# Patient Record
Sex: Female | Born: 1960 | Race: Black or African American | Hispanic: No | Marital: Married | Smoking: Current every day smoker
Health system: Southern US, Community
[De-identification: ages and names within clinical notes are randomized; demographics above are authoritative.]

## PROBLEM LIST (undated history)

## (undated) DIAGNOSIS — F329 Major depressive disorder, single episode, unspecified: Secondary | ICD-10-CM

## (undated) DIAGNOSIS — F32A Depression, unspecified: Secondary | ICD-10-CM

## (undated) DIAGNOSIS — K2970 Gastritis, unspecified, without bleeding: Secondary | ICD-10-CM

## (undated) DIAGNOSIS — F1010 Alcohol abuse, uncomplicated: Secondary | ICD-10-CM

## (undated) DIAGNOSIS — K8020 Calculus of gallbladder without cholecystitis without obstruction: Secondary | ICD-10-CM

## (undated) DIAGNOSIS — F419 Anxiety disorder, unspecified: Secondary | ICD-10-CM

## (undated) DIAGNOSIS — K859 Acute pancreatitis without necrosis or infection, unspecified: Secondary | ICD-10-CM

## (undated) DIAGNOSIS — K209 Esophagitis, unspecified without bleeding: Secondary | ICD-10-CM

## (undated) DIAGNOSIS — K922 Gastrointestinal hemorrhage, unspecified: Secondary | ICD-10-CM

## (undated) DIAGNOSIS — R519 Headache, unspecified: Secondary | ICD-10-CM

## (undated) DIAGNOSIS — D696 Thrombocytopenia, unspecified: Secondary | ICD-10-CM

## (undated) DIAGNOSIS — K311 Adult hypertrophic pyloric stenosis: Secondary | ICD-10-CM

## (undated) DIAGNOSIS — D649 Anemia, unspecified: Secondary | ICD-10-CM

## (undated) DIAGNOSIS — R51 Headache: Secondary | ICD-10-CM

## (undated) DIAGNOSIS — R0602 Shortness of breath: Secondary | ICD-10-CM

## (undated) HISTORY — PX: FOOT SURGERY: SHX648

## (undated) HISTORY — DX: Thrombocytopenia, unspecified: D69.6

## (undated) HISTORY — DX: Anemia, unspecified: D64.9

## (undated) HISTORY — PX: TOTAL ABDOMINAL HYSTERECTOMY: SHX209

## (undated) HISTORY — DX: Gastrointestinal hemorrhage, unspecified: K92.2

## (undated) HISTORY — DX: Gastritis, unspecified, without bleeding: K29.70

## (undated) HISTORY — DX: Esophagitis, unspecified without bleeding: K20.90

## (undated) HISTORY — DX: Esophagitis, unspecified: K20.9

## (undated) HISTORY — DX: Adult hypertrophic pyloric stenosis: K31.1

## (undated) SURGERY — EGD (ESOPHAGOGASTRODUODENOSCOPY)
Anesthesia: Moderate Sedation

---

## 2012-03-29 ENCOUNTER — Emergency Department (HOSPITAL_COMMUNITY): Payer: Self-pay

## 2012-03-29 ENCOUNTER — Inpatient Hospital Stay (HOSPITAL_COMMUNITY)
Admission: EM | Admit: 2012-03-29 | Discharge: 2012-04-02 | DRG: 378 | Disposition: A | Discharge status: Home / Self Care | Payer: MEDICAID | Attending: Family Medicine | Admitting: Family Medicine

## 2012-03-29 ENCOUNTER — Encounter (HOSPITAL_COMMUNITY): Payer: Self-pay | Admitting: *Deleted

## 2012-03-29 DIAGNOSIS — F10239 Alcohol dependence with withdrawal, unspecified: Secondary | ICD-10-CM | POA: Diagnosis present

## 2012-03-29 DIAGNOSIS — K208 Other esophagitis without bleeding: Secondary | ICD-10-CM | POA: Diagnosis present

## 2012-03-29 DIAGNOSIS — K219 Gastro-esophageal reflux disease without esophagitis: Secondary | ICD-10-CM | POA: Diagnosis present

## 2012-03-29 DIAGNOSIS — D649 Anemia, unspecified: Secondary | ICD-10-CM | POA: Diagnosis present

## 2012-03-29 DIAGNOSIS — K922 Gastrointestinal hemorrhage, unspecified: Secondary | ICD-10-CM | POA: Diagnosis present

## 2012-03-29 DIAGNOSIS — F10939 Alcohol use, unspecified with withdrawal, unspecified: Secondary | ICD-10-CM | POA: Diagnosis present

## 2012-03-29 DIAGNOSIS — R1084 Generalized abdominal pain: Secondary | ICD-10-CM

## 2012-03-29 DIAGNOSIS — K228 Other specified diseases of esophagus: Secondary | ICD-10-CM | POA: Diagnosis present

## 2012-03-29 DIAGNOSIS — F1020 Alcohol dependence, uncomplicated: Secondary | ICD-10-CM | POA: Diagnosis present

## 2012-03-29 DIAGNOSIS — K2289 Other specified disease of esophagus: Secondary | ICD-10-CM | POA: Diagnosis present

## 2012-03-29 DIAGNOSIS — K2921 Alcoholic gastritis with bleeding: Principal | ICD-10-CM | POA: Diagnosis present

## 2012-03-29 DIAGNOSIS — T65891A Toxic effect of other specified substances, accidental (unintentional), initial encounter: Secondary | ICD-10-CM | POA: Diagnosis present

## 2012-03-29 DIAGNOSIS — R109 Unspecified abdominal pain: Secondary | ICD-10-CM | POA: Diagnosis present

## 2012-03-29 DIAGNOSIS — R000 Tachycardia, unspecified: Secondary | ICD-10-CM | POA: Diagnosis present

## 2012-03-29 DIAGNOSIS — F1010 Alcohol abuse, uncomplicated: Secondary | ICD-10-CM | POA: Diagnosis present

## 2012-03-29 DIAGNOSIS — T510X4A Toxic effect of ethanol, undetermined, initial encounter: Secondary | ICD-10-CM | POA: Diagnosis present

## 2012-03-29 DIAGNOSIS — K920 Hematemesis: Secondary | ICD-10-CM

## 2012-03-29 DIAGNOSIS — K861 Other chronic pancreatitis: Secondary | ICD-10-CM | POA: Diagnosis present

## 2012-03-29 DIAGNOSIS — E876 Hypokalemia: Secondary | ICD-10-CM | POA: Diagnosis present

## 2012-03-29 DIAGNOSIS — K2100 Gastro-esophageal reflux disease with esophagitis, without bleeding: Secondary | ICD-10-CM

## 2012-03-29 DIAGNOSIS — K311 Adult hypertrophic pyloric stenosis: Secondary | ICD-10-CM | POA: Diagnosis present

## 2012-03-29 DIAGNOSIS — D696 Thrombocytopenia, unspecified: Secondary | ICD-10-CM

## 2012-03-29 HISTORY — DX: Alcohol abuse, uncomplicated: F10.10

## 2012-03-29 HISTORY — DX: Acute pancreatitis without necrosis or infection, unspecified: K85.90

## 2012-03-29 HISTORY — DX: Shortness of breath: R06.02

## 2012-03-29 HISTORY — DX: Calculus of gallbladder without cholecystitis without obstruction: K80.20

## 2012-03-29 LAB — COMPREHENSIVE METABOLIC PANEL
Albumin: 4.5 g/dL (ref 3.5–5.2)
Alkaline Phosphatase: 135 U/L — ABNORMAL HIGH (ref 39–117)
BUN: 14 mg/dL (ref 6–23)
Chloride: 95 mEq/L — ABNORMAL LOW (ref 96–112)
GFR calc Af Amer: >90 mL/min (ref >90)
Glucose, Bld: 121 mg/dL — ABNORMAL HIGH (ref 70–99)
Potassium: 3.7 mEq/L (ref 3.5–5.1)
Total Bilirubin: 1.3 mg/dL — ABNORMAL HIGH (ref 0.3–1.2)

## 2012-03-29 LAB — LIPASE, BLOOD: Lipase: 157 U/L — ABNORMAL HIGH (ref 11–59)

## 2012-03-29 LAB — CBC WITH DIFFERENTIAL/PLATELET
Basophils Relative: 1 % (ref 0–1)
HCT: 32.6 % — ABNORMAL LOW (ref 36.0–46.0)
Hemoglobin: 11.1 g/dL — ABNORMAL LOW (ref 12.0–15.0)
Lymphocytes Relative: 28 % (ref 12–46)
Monocytes Relative: 10 % (ref 3–12)
Neutro Abs: 3.8 10*3/uL (ref 1.7–7.7)
WBC: 6.3 10*3/uL (ref 4.0–10.5)

## 2012-03-29 LAB — URINALYSIS, ROUTINE W REFLEX MICROSCOPIC
Ketones, ur: >80 mg/dL — AB
Nitrite: NEGATIVE
pH: 6 (ref 5.0–8.0)

## 2012-03-29 LAB — TYPE AND SCREEN: ABO/RH(D): B POS

## 2012-03-29 LAB — URINE MICROSCOPIC-ADD ON

## 2012-03-29 MED ORDER — SODIUM CHLORIDE 0.9 % IV BOLUS (SEPSIS)
1000.0000 mL | Freq: Once | INTRAVENOUS | Status: AC
  Administered 2012-03-29: 1000 mL via INTRAVENOUS

## 2012-03-29 MED ORDER — ONDANSETRON HCL 4 MG/2ML IJ SOLN
INTRAMUSCULAR | Status: AC
  Filled 2012-03-29: qty 2

## 2012-03-29 MED ORDER — SODIUM CHLORIDE 0.9 % IV SOLN
80.0000 mg | Freq: Once | INTRAVENOUS | Status: AC
  Administered 2012-03-29: 80 mg via INTRAVENOUS
  Filled 2012-03-29: qty 80

## 2012-03-29 MED ORDER — LORAZEPAM 2 MG/ML IJ SOLN: 1.0000 mg | Freq: Four times a day (QID) | INTRAMUSCULAR | Status: AC | PRN

## 2012-03-29 MED ORDER — THIAMINE HCL 100 MG/ML IJ SOLN
100.0000 mg | Freq: Every day | INTRAMUSCULAR | Status: DC
  Administered 2012-03-30: 100 mg via INTRAVENOUS
  Filled 2012-03-29 (×4): qty 1

## 2012-03-29 MED ORDER — PROMETHAZINE HCL 25 MG/ML IJ SOLN
12.5000 mg | Freq: Once | INTRAMUSCULAR | Status: AC
  Administered 2012-03-29: 12.5 mg via INTRAVENOUS
  Filled 2012-03-29: qty 1

## 2012-03-29 MED ORDER — MORPHINE SULFATE 4 MG/ML IJ SOLN
4.0000 mg | Freq: Once | INTRAMUSCULAR | Status: AC
  Administered 2012-03-29: 4 mg via INTRAVENOUS
  Filled 2012-03-29: qty 1

## 2012-03-29 MED ORDER — METOCLOPRAMIDE HCL 5 MG/ML IJ SOLN
10.0000 mg | Freq: Once | INTRAMUSCULAR | Status: AC
  Administered 2012-03-29: 10 mg via INTRAVENOUS
  Filled 2012-03-29: qty 2

## 2012-03-29 MED ORDER — SODIUM CHLORIDE 0.9 % IV SOLN: 1000.0000 mL | Freq: Once | INTRAVENOUS | Status: DC

## 2012-03-29 MED ORDER — SODIUM CHLORIDE 0.9 % IV SOLN
8.0000 mg/h | INTRAVENOUS | Status: DC
  Administered 2012-03-29 – 2012-03-30 (×2): 8 mg/h via INTRAVENOUS
  Filled 2012-03-29 (×4): qty 80

## 2012-03-29 MED ORDER — ADULT MULTIVITAMIN W/MINERALS CH
1.0000 | ORAL_TABLET | Freq: Every day | ORAL | Status: DC
  Administered 2012-03-31 – 2012-04-02 (×3): 1 via ORAL
  Filled 2012-03-29 (×4): qty 1

## 2012-03-29 MED ORDER — VITAMIN B-1 100 MG PO TABS
100.0000 mg | ORAL_TABLET | Freq: Every day | ORAL | Status: DC
  Administered 2012-03-31 – 2012-04-02 (×3): 100 mg via ORAL
  Filled 2012-03-29 (×4): qty 1

## 2012-03-29 MED ORDER — DIPHENHYDRAMINE HCL 50 MG/ML IJ SOLN
12.5000 mg | Freq: Once | INTRAMUSCULAR | Status: AC
  Administered 2012-03-29: 12.5 mg via INTRAVENOUS
  Filled 2012-03-29: qty 1

## 2012-03-29 MED ORDER — DEXTROSE-NACL 5-0.45 % IV SOLN: INTRAVENOUS | Status: AC

## 2012-03-29 MED ORDER — ONDANSETRON HCL 4 MG/2ML IJ SOLN
4.0000 mg | Freq: Once | INTRAMUSCULAR | Status: AC
  Administered 2012-03-29: 20:00:00 via INTRAVENOUS
  Filled 2012-03-29: qty 2

## 2012-03-29 MED ORDER — HYDROMORPHONE HCL PF 1 MG/ML IJ SOLN
1.0000 mg | Freq: Once | INTRAMUSCULAR | Status: AC
  Administered 2012-03-29: 1 mg via INTRAVENOUS
  Filled 2012-03-29: qty 1

## 2012-03-29 MED ORDER — SODIUM CHLORIDE 0.9 % IV SOLN
1000.0000 mL | INTRAVENOUS | Status: DC
  Administered 2012-03-30: 125 mL via INTRAVENOUS
  Administered 2012-03-30: 1000 mL via INTRAVENOUS

## 2012-03-29 MED ORDER — LORAZEPAM 1 MG PO TABS
1.0000 mg | ORAL_TABLET | Freq: Four times a day (QID) | ORAL | Status: AC | PRN
  Administered 2012-03-30 – 2012-04-01 (×4): 1 mg via ORAL
  Filled 2012-03-29 (×4): qty 1

## 2012-03-29 MED ORDER — FOLIC ACID 1 MG PO TABS
1.0000 mg | ORAL_TABLET | Freq: Every day | ORAL | Status: DC
  Administered 2012-03-30 – 2012-04-02 (×4): 1 mg via ORAL
  Filled 2012-03-29 (×4): qty 1

## 2012-03-29 MED ORDER — LORAZEPAM 2 MG/ML IJ SOLN
1.0000 mg | Freq: Once | INTRAMUSCULAR | Status: AC
  Administered 2012-03-29: 1 mg via INTRAVENOUS
  Filled 2012-03-29: qty 1

## 2012-03-29 MED ORDER — ONDANSETRON HCL 4 MG/2ML IJ SOLN: 4.0000 mg | Freq: Three times a day (TID) | INTRAMUSCULAR | Status: DC | PRN

## 2012-03-29 MED ORDER — ONDANSETRON HCL 4 MG/2ML IJ SOLN
4.0000 mg | Freq: Once | INTRAMUSCULAR | Status: AC
  Administered 2012-03-29: 4 mg via INTRAVENOUS

## 2012-03-29 NOTE — ED Provider Notes (Addendum)
History     CSN: [id number]  Arrival date & time 03/29/12  2008   First MD Initiated Contact with Patient 03/29/12 2040      Chief Complaint  Patient presents with  . Emesis    (Consider location/radiation/quality/duration/timing/severity/associated sxs/prior treatment) HPI Patient is a 51 year old female with history of chronic alcoholism as well as biliary colic, pancreatitis who presents today complaining of 15 out of 10 epigastric abdominal pain without radiation. She describes as a sharp. Patient also complains of having several episodes of hematemesis as well as dark tarry stools. Patient reports that her last drink was yesterday. Patient suspects she drank probably a fifth of alcohol. She is alert and oriented but tachycardic on presentation. Patient denies any urinary symptoms, cough, or fevers. She has just moved here from [place]. Patient does not have any physicians in the area. She reports that she is ready to stop drinking today. There no other associated or modifying factors. Past Medical History  Diagnosis Date  . Gallstones   . Pancreatitis   . ETOH abuse     History reviewed. No pertinent past surgical history.  No family history on file.  History  Substance Use Topics  . Smoking status: Not on file  . Smokeless tobacco: Not on file  . Alcohol Use:     OB History    Grav Para Term Preterm Abortions TAB SAB Ect Mult Living                  Review of Systems  Constitutional: Negative.   Eyes: Negative.   Respiratory: Negative.   Cardiovascular: Negative.   Gastrointestinal: Positive for nausea, vomiting, abdominal pain and blood in stool.  Genitourinary: Negative.   Musculoskeletal: Negative.   Skin: Negative.   Neurological: Positive for headaches.  Hematological: Negative.   Psychiatric/Behavioral: Negative.   All other systems reviewed and are negative.    Allergies  Motrin and Penicillins  Home Medications  No current outpatient  prescriptions on file.  BP 135/88  Pulse 113  Temp 98.8 F (37.1 C) (Oral)  Resp 18  SpO2 99%  Physical Exam  Nursing note and vitals reviewed. GEN: Well-developed, thin chronically ill-appearing female in mild distress HEENT: Atraumatic, normocephalic. Oropharynx clear without erythema EYES: PERRLA BL, no scleral icterus. NECK: Trachea midline, no meningismus CV: Tachycardic with regular rhythm. No murmurs, rubs, or gallops PULM: No respiratory distress.  No crackles, wheezes, or rales. GI: soft, diffuse tenderness to palpation but most pronounced in the epigastrium. No guarding, rebound. + bowel sounds. Rectal exam with obvious large external hemorrhoids, soft brown stool on the glove, no gross blood, guaiac-negative sample.  GU: deferred Neuro: cranial nerves grossly 2-12 intact, no abnormalities of strength or sensation, A and O x 3 MSK: Patient moves all 4 extremities symmetrically, no deformity, edema, or injury noted Skin: No rashes petechiae, purpura, or jaundice Psych: no abnormality of mood   ED Course  Procedures (including critical care time)   Date: 03/29/2012  Indication: Tachycardia Please note this EKG was reviewed extemporaneously by myself.   Date: 03/29/2012  Rate: 114  Rhythm: sinus tachycardia  QRS Axis: normal  Intervals: normal  ST/T Wave abnormalities: normal and nonspecific T wave changes  Conduction Disutrbances:none  Narrative Interpretation:   Old EKG Reviewed: none available       Labs Reviewed  CBC WITH DIFFERENTIAL - Abnormal; Notable for the following:    RBC 3.53 (*)     Hemoglobin 11.1 (*)  HCT 32.6 (*)     Platelets 97 (*)     All other components within normal limits  COMPREHENSIVE METABOLIC PANEL - Abnormal; Notable for the following:    Sodium 134 (*)     Chloride 95 (*)     CO2 14 (*)     Glucose, Bld 121 (*)     AST 89 (*)     Alkaline Phosphatase 135 (*)     Total Bilirubin 1.3 (*)     All other components within  normal limits  LIPASE, BLOOD - Abnormal; Notable for the following:    Lipase 157 (*)     All other components within normal limits  URINALYSIS, ROUTINE W REFLEX MICROSCOPIC - Abnormal; Notable for the following:    APPearance CLOUDY (*)     Hgb urine dipstick SMALL (*)     Bilirubin Urine MODERATE (*)     Ketones, ur >80 (*)     Protein, ur 100 (*)     Leukocytes, UA TRACE (*)     All other components within normal limits  URINE MICROSCOPIC-ADD ON - Abnormal; Notable for the following:    Squamous Epithelial / LPF MANY (*)     Bacteria, UA MANY (*)     All other components within normal limits  OCCULT BLOOD GASTRIC / DUODENUM - Abnormal; Notable for the following:    Occult Blood, Gastric POSITIVE (*)     All other components within normal limits  ETHANOL  LACTIC ACID, PLASMA  TYPE AND SCREEN  OCCULT BLOOD, POC DEVICE  ABO/RH   Ct Head Wo Contrast  03/30/2012  *RADIOLOGY REPORT*  Clinical Data: Weakness.  Dizziness.  Nausea and vomiting. Shortness of breath.  CT HEAD WITHOUT CONTRAST  Technique:  Contiguous axial images were obtained from the base of the skull through the vertex without contrast.  Comparison: None.  Findings: Mild cerebral atrophy.  No mass effect or midline shift. No abnormal extra-axial fluid collections.  Gray-white matter junctions are distinct.  No effacement of basal cisterns.  No evidence of acute intracranial hemorrhage.  No ventricular dilatation.  Mild vascular calcifications.  Visualized paranasal sinuses and mastoid air cells are not opacified.  No depressed skull fractures.  IMPRESSION: No acute intracranial abnormalities.  Original Report Authenticated By: Marlon Pel, M.D.   Dg Abd Acute W/chest  03/29/2012  *RADIOLOGY REPORT*  Clinical Data: Abdominal pain.  Hematemesis.  ACUTE ABDOMEN SERIES (ABDOMEN 2 VIEW & CHEST 1 VIEW)  Comparison: None.  Findings: Normal heart size and pulmonary vascularity.  No focal consolidation in the lungs.  No blunting  of costophrenic angles.  Scattered gas and stool in the colon.  No small or large bowel dilatation.  No free intra-abdominal air.  No abnormal air fluid levels.  No radiopaque stones.  IMPRESSION: No evidence of active pulmonary disease.  Nonobstructive bowel gas pattern.  Original Report Authenticated By: Marlon Pel, M.D.     1. GI bleeding   2. Tachycardia   3. Abdominal pain       MDM   Based on my evaluation patient was evaluated for her abdominal pain.  This included laboratory workup for this. Given patient's tachycardia an EKG was performed and showed no acute ischemic changes. Patient did have nonspecific T wave inversion noted. No old EKG was available. Patient reported having hematemesis as well as dark tarry stools. She denied history of having positive GI bleed in the past. She did endorse having an EGD performed but  denied any history of colonoscopy. Patient had acute abdominal series with no free air or other concerning findings. CBC showed no leukocytosis and hemoglobin was 11.1. Type and screen was performed. Patient slightly elevated AST at 89 which is not surprising given her report of chronic alcohol ingestion. Patient had lipase of 157 as well. While this does raise concerns for increased related to possible pancreatitis this also was relatively impressive. Patient did have several episodes of gagging here and one of these did produce brownish discharge. Gastric colds positive. Rectal exam was performed and showed soft brown stool. This is guaiac-positive for likely related to patient's external hemorrhoids. Patient remained somewhat tachycardic despite IV fluids. She received protonic bolus and infusion. Alcohol level was sent was negative. Patient also had normal lactate. Urinalysis showed no signs of infection but did show greater than 80 ketones. Patient did admit to feeling shaky later in her visit was given a dose of Ativan. Based on patient's lack positive emesis as  well as her report I did feel that admission at least overnight observation was warranted. Patient also was questioned and reported that she was ready to stop drinking. Given patient's persistent tachycardia I do have concerns about possible development of withdrawal symptoms. A call was placed to the hospitalist for admission. Patient discussed with Dr. Adrian Blackwater who agreed to admission with observation status.   12:19 AM   While in the emergency department under my care the patient was treated her symptoms. Patient received initially a dose of morphine and Zofran. With this patient continued to have vomiting and began to complain of her typical migraine headache as well. Patient was given a dose of 10 of Reglan and 12.5 of Benadryl IV. With this patient felt that her headache was better but continued to complain of abdominal pain and nausea. Patient reported that promethazine was all that worked for her nausea.  A dose this was given as well as 1 mg of Dilaudid IV.  Patient also reported that she was feeling shaky as she does when she has alcohol withdrawal. She was asked specifically about having taken Ativan in the past for this and his reported that she had had this previously. Patient was given a dose of 1 mg of Ativan IV. Following this call was placed for admission. Patient had been reassessed at every administration of medication. Per nursing patient was fine with administration of Dilaudid and Phenergan. Patient then was given a dose of Ativan for her shakiness. Following this family reported that she does not do well with Ativan. Hospitalist assess patient for her to be somnolent. Patient was more tachycardic at that time. Blood pressures worse at 100 systolic. A rectal temperature was checked and was 99. Patient was given one dose of Narcan 0.4 mg IV without significant change in metal status. Given that patient had earlier complained of headache and had some vital sign changes CT head noncontrast  was performed.  This was negative.  Patient has been seen by hospitalist at bedside and we agree patient may have a combination of withdrawal symptoms and drug effects.     Cyndra Numbers, MD 03/29/12 4098  Cyndra Numbers, MD 03/30/12 0120

## 2012-03-29 NOTE — ED Notes (Addendum)
Pt from home, recently moved from [place], [place]. Has a history of ETOH abuse and chronic pancreatitis and gallstones. Pt reports emesis and tarry stool x 3 days. Pt alert and oriented x 4, VSS. 4mg  Zofran given via piv and 500cc bolus of NS given by [facility].

## 2012-03-29 NOTE — ED Notes (Signed)
Lisa [facility] (daughter) [phone] Roshni Burbano (husband) [phone]

## 2012-03-30 ENCOUNTER — Encounter (HOSPITAL_COMMUNITY): Payer: Self-pay | Admitting: *Deleted

## 2012-03-30 ENCOUNTER — Inpatient Hospital Stay (HOSPITAL_COMMUNITY): Payer: Self-pay

## 2012-03-30 DIAGNOSIS — K859 Acute pancreatitis without necrosis or infection, unspecified: Secondary | ICD-10-CM

## 2012-03-30 DIAGNOSIS — K922 Gastrointestinal hemorrhage, unspecified: Secondary | ICD-10-CM

## 2012-03-30 LAB — AMMONIA: Ammonia: 53 umol/L (ref 11–60)

## 2012-03-30 LAB — COMPREHENSIVE METABOLIC PANEL
CO2: 17 mEq/L — ABNORMAL LOW (ref 19–32)
Calcium: 8.6 mg/dL (ref 8.4–10.5)
Creatinine, Ser: 0.6 mg/dL (ref 0.50–1.10)
GFR calc Af Amer: >90 mL/min (ref >90)
GFR calc non Af Amer: >90 mL/min (ref >90)
Glucose, Bld: 120 mg/dL — ABNORMAL HIGH (ref 70–99)
Total Protein: 6.8 g/dL (ref 6.0–8.3)

## 2012-03-30 LAB — CBC
Hemoglobin: 9.2 g/dL — ABNORMAL LOW (ref 12.0–15.0)
RBC: 2.97 MIL/uL — ABNORMAL LOW (ref 3.87–5.11)

## 2012-03-30 LAB — ABO/RH: ABO/RH(D): B POS

## 2012-03-30 LAB — PROTIME-INR
INR: 1.08 (ref 0.00–1.49)
Prothrombin Time: 14.2 seconds (ref 11.6–15.2)

## 2012-03-30 MED ORDER — NALOXONE HCL 0.4 MG/ML IJ SOLN
INTRAMUSCULAR | Status: AC
  Administered 2012-03-30: 0.4 mg
  Filled 2012-03-30: qty 1

## 2012-03-30 MED ORDER — MORPHINE SULFATE 2 MG/ML IJ SOLN
2.0000 mg | Freq: Four times a day (QID) | INTRAMUSCULAR | Status: DC | PRN
  Administered 2012-03-31 (×2): 2 mg via INTRAVENOUS
  Filled 2012-03-30 (×2): qty 1

## 2012-03-30 MED ORDER — SUCRALFATE 1 GM/10ML PO SUSP
1.0000 g | Freq: Three times a day (TID) | ORAL | Status: DC
  Administered 2012-03-30 – 2012-04-02 (×11): 1 g via ORAL
  Filled 2012-03-30 (×17): qty 10

## 2012-03-30 MED ORDER — MORPHINE SULFATE 2 MG/ML IJ SOLN
1.0000 mg | INTRAMUSCULAR | Status: AC
  Administered 2012-03-30: 1 mg via INTRAVENOUS
  Filled 2012-03-30: qty 1

## 2012-03-30 MED ORDER — ALUM & MAG HYDROXIDE-SIMETH 200-200-20 MG/5ML PO SUSP: 30.0000 mL | Freq: Four times a day (QID) | ORAL | Status: DC | PRN

## 2012-03-30 MED ORDER — VITAMIN B-1 100 MG PO TABS
100.0000 mg | ORAL_TABLET | Freq: Every day | ORAL | Status: DC
  Filled 2012-03-30: qty 1

## 2012-03-30 MED ORDER — PANTOPRAZOLE SODIUM 40 MG IV SOLR
40.0000 mg | Freq: Two times a day (BID) | INTRAVENOUS | Status: DC
  Administered 2012-03-31: 40 mg via INTRAVENOUS
  Filled 2012-03-30 (×2): qty 40

## 2012-03-30 MED ORDER — DOCUSATE SODIUM 100 MG PO CAPS: 100.0000 mg | ORAL_CAPSULE | Freq: Every day | ORAL | Status: DC | PRN

## 2012-03-30 MED ORDER — MORPHINE SULFATE 2 MG/ML IJ SOLN: 1.0000 mg | Freq: Four times a day (QID) | INTRAMUSCULAR | Status: DC

## 2012-03-30 MED ORDER — ONDANSETRON HCL 4 MG/2ML IJ SOLN
4.0000 mg | Freq: Four times a day (QID) | INTRAMUSCULAR | Status: DC | PRN
  Administered 2012-03-30 – 2012-04-02 (×8): 4 mg via INTRAVENOUS
  Filled 2012-03-30 (×8): qty 2

## 2012-03-30 MED ORDER — MORPHINE SULFATE 2 MG/ML IJ SOLN
1.0000 mg | Freq: Four times a day (QID) | INTRAMUSCULAR | Status: DC | PRN
  Administered 2012-03-30: 1 mg via INTRAVENOUS
  Filled 2012-03-30: qty 1

## 2012-03-30 MED ORDER — ACETAMINOPHEN 325 MG PO TABS: 650.0000 mg | ORAL_TABLET | Freq: Four times a day (QID) | ORAL | Status: DC | PRN

## 2012-03-30 MED ORDER — ACETAMINOPHEN 650 MG RE SUPP: 650.0000 mg | Freq: Four times a day (QID) | RECTAL | Status: DC | PRN

## 2012-03-30 MED ORDER — ONDANSETRON HCL 4 MG PO TABS
4.0000 mg | ORAL_TABLET | Freq: Four times a day (QID) | ORAL | Status: DC | PRN
  Filled 2012-03-30: qty 1

## 2012-03-30 MED ORDER — NALOXONE HCL 1 MG/ML IJ SOLN
INTRAMUSCULAR | Status: AC
  Filled 2012-03-30: qty 2

## 2012-03-30 MED ORDER — M.V.I. ADULT IV INJ
INJECTION | Freq: Once | INTRAVENOUS | Status: AC
  Administered 2012-03-30: 04:00:00 via INTRAVENOUS
  Filled 2012-03-30: qty 1000

## 2012-03-30 NOTE — ED Notes (Signed)
Attempted to call Nursing report to [facility] [place]. RN unable to receive report. RN to call ER when able to receive report.

## 2012-03-30 NOTE — H&P (Signed)
PCP:   No primary provider on file.   Chief Complaint:  Hematemesis with abdominal pain  HPI:  The patient is currently sedated with medication given in the emergency department and is unable to provide history. The history is provided by family members as well as numerous department notes.   This is a 51 year old African American female with a history of pancreatitis and alcohol abuse who presents to the emergency department with hematemesis and abdominal pain.Patient presented with sharp nonradiating epigastric abdominal pain rated at "15 out of 10". She had several episodes of hematemesis as well as dark tarry black stools. She has a heavy alcohol history and has had multiple episodes of withdrawal. Her last drink was yesterday of probably a fifth of alcohol.  Review of Systems:  The patient denies anorexia, fever, weight loss,, vision loss, decreased hearing, hoarseness, chest pain, syncope, dyspnea on exertion, peripheral edema, balance deficits, hemoptysis, severe indigestion/heartburn, hematuria, incontinence, genital sores, muscle weakness, suspicious skin lesions, transient blindness, difficulty walking, depression, unusual weight change, abnormal bleeding, enlarged lymph nodes, angioedema, and breast masses.  Past Medical History: Past Medical History  Diagnosis Date  . Gallstones   . Pancreatitis   . ETOH abuse    History reviewed. No pertinent past surgical history.  Medications: Prior to Admission medications   Not on File    Allergies:   Allergies  Allergen Reactions  . Motrin (Ibuprofen)     Hives and itching  . Penicillins     Hives itching    Social History:  does not have a smoking history on file. She does not have any smokeless tobacco history on file. Her alcohol and drug histories not on file.  Family History: No family history on file.  Physical Exam: Filed Vitals:   03/29/12 2025 03/29/12 2356  BP: 135/88 108/71  Pulse: 113 138  Temp: 98.8 F  (37.1 C)   TempSrc: Oral   Resp: 18 11  SpO2: 99% 95%   General appearance: no distress and somnelent Head: Normocephalic, without obvious abnormality, atraumatic Eyes: conjunctivae/corneas clear. PERRL, EOM's intact. Fundi benign. Ears: normal TM's and external ear canals both ears Neck: no adenopathy, no JVD, supple, symmetrical, trachea midline and thyroid not enlarged, symmetric, no tenderness/mass/nodules Resp: clear to auscultation bilaterally and normal percussion bilaterally Cardio: regular rate and rhythm, S1, S2 normal, no murmur, click, rub or gallop GI: soft, bowel sounds appropriate, non-distended. Extremities: extremities normal, atraumatic, no cyanosis or edema Pulses: 2+ and symmetric Skin: Skin color, texture, turgor normal. No rashes or lesions Neurologic: Mental status: stirs slightly and withdraws to pain   Labs on Admission:   [facility] 03/29/12 2125  NA 134*  K 3.7  CL 95*  CO2 14*  GLUCOSE 121*  BUN 14  CREATININE 0.63  CALCIUM 9.6  MG --  PHOS --    Basename 03/29/12 2125  AST 89*  ALT 34  ALKPHOS 135*  BILITOT 1.3*  PROT 8.2  ALBUMIN 4.5    Basename 03/29/12 2125  LIPASE 157*  AMYLASE --    Basename 03/29/12 2125  WBC 6.3  NEUTROABS 3.8  HGB 11.1*  HCT 32.6*  MCV 92.4  PLT 97*   No results found for this basename: CKTOTAL:3,CKMB:3,CKMBINDEX:3,TROPONINI:3 in the last 72 hours No results found for this basename: TSH,T4TOTAL,FREET3,T3FREE,THYROIDAB in the last 72 hours No results found for this basename: VITAMINB12:2,FOLATE:2,FERRITIN:2,TIBC:2,IRON:2,RETICCTPCT:2 in the last 72 hours  Radiological Exams on Admission: Dg Abd Acute W/chest  03/29/2012  *RADIOLOGY REPORT*  Clinical Data: Abdominal  pain.  Hematemesis.  ACUTE ABDOMEN SERIES (ABDOMEN 2 VIEW & CHEST 1 VIEW)  Comparison: None.  Findings: Normal heart size and pulmonary vascularity.  No focal consolidation in the lungs.  No blunting of costophrenic angles.  Scattered gas and  stool in the colon.  No small or large bowel dilatation.  No free intra-abdominal air.  No abnormal air fluid levels.  No radiopaque stones.  IMPRESSION: No evidence of active pulmonary disease.  Nonobstructive bowel gas pattern.  Original Report Authenticated By: Marlon Pel, M.D.    Assessment/Plan Present on Admission:  .Alcohol abuse .Hematemesis .Tachycardia  #1 GI bleed with hematemesis. The patient is currently hemodynamically stable and is not anemic. Due to symptoms, this likely represents a gastric or duodenal ulcer. We will admit the patient to observation with telemetry as the patient is currently somnolent. We will continue Protonix infusion and give the patient Carafate when tolerating by mouth. We will also give the patient antiemetics. The patient continues to have abdominal pain and melena the morning, would consider EGD either here or as an outpatient.  #2 alcohol withdrawal: We will continue alcohol withdrawal precautions, specifically as the patient has a history of withdrawal. Will give the patient thiamine and folic acid.  #3 tachycardia. Likely secondary to #2. We'll continue to monitor.  #4 DVT prophylaxis: SCDs as the patient is currently bleeding.  #5 CODE STATUS: Full code   STINSON, JACOB JEHIEL 03/30/2012, 12:00 AM

## 2012-03-30 NOTE — Progress Notes (Signed)
Admission H&P from 8/6 reviewed   51 year old female with history off alcoholism, alcoholic pancreatitis, gallstones who is new to [place] and previously had her care at [facility] came with 4 episodes of hematemesis and dark tarry stools since yesterday. Patient noted to have a hemoglobin of 11 on presentation (baseline not normal) in the setting off active alcohol use. She was also noted to be tremulous with concern for early alcohol withdrawal on presentation.  Patient started on IV fluids and PPI drip on admission. Subsequent hemoglobin dropped to 9.2 and has low platelets as well. No further hematemesis noted since admission.  Vitals : Pulse 65, temperature 99 F., blood pressure 118/65 mmHg O2 sat of 98% on room air poorly groomed thin built female lying in bed in no acute distress HEENT: No pallor, mildly icteric Chest clear to auscultation bilaterally CVS normal S1 and S2 no murmurs rub or gallop Abdomen soft tender to palpation bowel sounds present Extremities no edema CNS sleepy but arousable and  answers questions appropriately. Fine tremors  Labs reviewed  Assessment/ plan Hematemesis Possibly in the setting off of alcoholic gastritis versus esophageal variceal bleed Continue IV fluids and IV PPI Monitor serial [facility] involved in patient care plan for EGD    History of alcoholic pancreatitis and gallstones Daily dated lipase on presentation. We'll closely monitor. Will need abdominal CT if symptoms worsen  Early alcohol withdrawal Continue CIWA  Protocol Continue thiamine folate and multivitamin  DVT prophylaxis SCD

## 2012-03-30 NOTE — Consult Note (Signed)
EGD                                  [facility] Consult: 11:19 AM 03/30/2012   Referring Provider:  Dr Gonzella Lex  Primary Care Physician:  In [place] "ogofre" Primary Gastroenterologist:  No one locally.  Not determined for [place]  Reason for Consultation:  abd pain and hemetemesis  HPI: Lisa Stuart is a 51 y.o. female.  Alcoholic with hx of pancreatitis, gallstones.  Presented to ED with non-radiating epigastric pain.  Several episodes of coffee ground emesis.  All began 2 to 3 days ago.  Pt unable to tell me if this pain is same pain as with pancreatitis.  Daughter says pt has had dx of gallstones and that MDs in [place] were waiting for stones to "liquify" before proceeding with surgery.  Baseline not clear as to po intake, BMs.  She has had dark stools noted by family.  She is acutely confused, baseline MS not known.  Daughter says mom drinks one pint or more of gin or "brown liquor" daily.  Pt does not take her previously prescribed meds, only takes a B vitamin daily.  Used Aleve once, but it did not help her pain. Apparently did not have significant pain or vomitting before the last few days  I have requested records of admission in [place], about 2 to 3 months ago, but no records at present.  Not clear if pt ever had EGD or ERCP. Iwas able to reach dtr at [phone]  ROS Limited as pt vague and daughter unaware of many details No hematuria.  Has taken falls at home.   Last ETOH was yesterday. No bloody stools, but they were observed to be dark.  No rash or itching.  Non-compliant with prescribed meds.   Past Medical History  Diagnosis Date  . Gallstones   . Pancreatitis   . ETOH abuse     History reviewed. No pertinent past surgical history.  Prior to Admission medications   Not on File    Scheduled Meds:    . sodium chloride  1,000 mL Intravenous Once  . dextrose 5 % and 0.45% NaCl   Intravenous STAT  . diphenhydrAMINE  12.5 mg Intravenous  Once  . folic acid  1 mg Oral Daily  .  HYDROmorphone (DILAUDID) injection  1 mg Intravenous Once  . LORazepam  1 mg Intravenous Once  . metoCLOPramide (REGLAN) injection  10 mg Intravenous Once  .  morphine injection  4 mg Intravenous Once  . multivitamin with minerals  1 tablet Oral Daily  . naloxone      . naloxone      . ondansetron      . ondansetron  4 mg Intravenous Once  . ondansetron  4 mg Intravenous Once  . pantoprazole (PROTONIX) IV  80 mg Intravenous Once  . promethazine  12.5 mg Intravenous Once  . general admission iv infusion   Intravenous Once  . sodium chloride  1,000 mL Intravenous Once  . sodium chloride  1,000 mL Intravenous Once  . sucralfate  1 g Oral TID WC & HS  . thiamine  100 mg Oral Daily   Or  . thiamine  100 mg Intravenous Daily  . DISCONTD: thiamine  100 mg Oral Daily   Infusions:    . sodium chloride 1,000 mL (03/30/12 1103)  . pantoprozole (PROTONIX) infusion 8 mg/hr (03/30/12 1102)   PRN  Meds: acetaminophen, acetaminophen, alum & mag hydroxide-simeth, docusate sodium, LORazepam, LORazepam, ondansetron (ZOFRAN) IV, ondansetron, DISCONTD: ondansetron (ZOFRAN) IV   Allergies as of 03/29/2012 - Review Complete 03/29/2012  Allergen Reaction Noted  . Motrin (ibuprofen)  03/29/2012  . Penicillins  03/29/2012    No family history on file.  History   Social History  . Marital Status: Married    Spouse Name: N/A    Number of Children: N/A  . Years of Education: N/A   Occupational History  . Not on file.   Social History Main Topics  . Smoking status: At least one pack per day. Often more than that.   . Smokeless tobacco: Not on file  . Alcohol Use:   . Drug Use:   . Sexually Active:    Social History Narrative  . Pt and husband recently relocated from [place] to live with dtr   PHYSICAL EXAM: Vital signs in last 24 hours: Temp:  [98.3 F (36.8 C)-99 F (37.2 C)] 99 F (37.2 C) (08/06 0650) Pulse Rate:  [65-138] 65   (08/06 0650) Resp:  [11-18] 16  (08/06 0650) BP: (108-135)/(64-88) 118/65 mmHg (08/06 0650) SpO2:  [95 %-99 %] 98 % (08/06 0650) Weight:  [101 lb 13.6 oz (46.2 kg)] 101 lb 13.6 oz (46.2 kg) (08/06 0257)  General: looks cachectic and acutely ill.  Looks old for age. Head:  No swelling of face, symmetric  Eyes:  No icterus, conj pale.  EO<I Ears:  Not HOH  Nose:  No discharge or bleeding Mouth:  Poor dentition.  Pink, clear mucosa.  No blood in mouth Neck:  No mass or JVD Lungs:  Clear B.  Vocal quality is hoarse. Heart: RRR.  No MRG Abdomen:  Soft, protuberant, tender in upper abdomen and left . Right UQ.  No HSM, mass or bruits.   Rectal: not done .  FOB + in lab  Musc/Skeltl: no joint deformity or swelling.  Wasting of musculature on limbs Extremities:  No pedal edema  Neurologic:  No asterixis, oriented to place and self, appropriate but poor recall of details of many recent events concerning her health Skin:  No rash or sores Tattoos:  None seen Nodes:  No cervical adenopathy   Psych:  Cooperative, acutely distressed with paroxysms of pain.   Intake/Output from previous day:   Intake/Output this shift: Total I/O In: -  Out: 800 [Urine:800]  LAB RESULTS:  Basename 03/30/12 0355 03/29/12 2125  WBC 4.7 6.3  HGB 9.2* 11.1*  HCT 27.4* 32.6*  PLT 88* 97*   BMET Lab Results  Component Value Date   NA 138 03/30/2012   NA 134* 03/29/2012   K 3.8 03/30/2012   K 3.7 03/29/2012   CL 106 03/30/2012   CL 95* 03/29/2012   CO2 17* 03/30/2012   CO2 14* 03/29/2012   GLUCOSE 120* 03/30/2012   GLUCOSE 121* 03/29/2012   BUN 11 03/30/2012   BUN 14 03/29/2012   CREATININE 0.60 03/30/2012   CREATININE 0.63 03/29/2012   CALCIUM 8.6 03/30/2012   CALCIUM 9.6 03/29/2012   LFT  Basename 03/30/12 0355 03/29/12 2125  PROT 6.8 8.2  ALBUMIN 3.6 4.5  AST 72* 89*  ALT 28 34  ALKPHOS 110 135*  BILITOT 1.1 1.3*  BILIDIR -- --  IBILI -- --  Lipase        157  ETOH level  < 11.0  PT/INR No results found for  this basename: INR,  PROTIME   Drugs  of Abuse  No results found for this basename: labopia,  cocainscrnur,  labbenz,  amphetmu,  thcu,  labbarb     RADIOLOGY STUDIES: Ct Head Wo Contrast 03/30/2012  *RADIOLOGY REPORT*  Clinical Data: Weakness.  Dizziness.  Nausea and vomiting. Shortness of breath.  CT HEAD WITHOUT CONTRAST  Technique:  Contiguous axial images were obtained from the base of the skull through the vertex without contrast.  Comparison: None.  Findings: Mild cerebral atrophy.  No mass effect or midline shift. No abnormal extra-axial fluid collections.  Gray-white matter junctions are distinct.  No effacement of basal cisterns.  No evidence of acute intracranial hemorrhage.  No ventricular dilatation.  Mild vascular calcifications.  Visualized paranasal sinuses and mastoid air cells are not opacified.  No depressed skull fractures.  IMPRESSION: No acute intracranial abnormalities.  Original Report Authenticated By: Marlon Pel, M.D.   Dg Abd Acute W/chest 03/29/2012  *RADIOLOGY REPORT*  Clinical Data: Abdominal pain.  Hematemesis.  ACUTE ABDOMEN SERIES (ABDOMEN 2 VIEW & CHEST 1 VIEW)  Comparison: None.  Findings: Normal heart size and pulmonary vascularity.  No focal consolidation in the lungs.  No blunting of costophrenic angles.  Scattered gas and stool in the colon.  No small or large bowel dilatation.  No free intra-abdominal air.  No abnormal air fluid levels.  No radiopaque stones.  IMPRESSION: No evidence of active pulmonary disease.  Nonobstructive bowel gas pattern.  Original Report Authenticated By: Marlon Pel, M.D.    ENDOSCOPIC STUDIES: Not known, possible EGD.  Have requested med recs form [place]  IMPRESSION: *  Coffee ground emesis. Rule out alcoholic gastroesophagitis, rule out portal gastropathy, rule out esophageal varices.  Rule out MWT On empiric PPI drip per hospitalist.  *  ABL and  Post hydration anemia. *  Hx alcoholic pancreatitis *  Hx  gallstones.  Wonder if this is source of her pain.  *  Chronic alcoholism. *  Confusion, encephalopathy.  Suspect acute withdrawal from ETOH *  Hyperglycemia.   PLAN: *  EGD *  PT, INR, ammonia level today.   CBC in AM *  Faxed request for med records from [place]    LOS: 1 day   Jennye Moccasin  03/30/2012, 11:19 AM Pager: [phone]    I have reviewed the above note, examined the patient and agree with plan of treatment.She is now more alert and answers questions. Low volume hematemesis after prolonged vomiting, likely due to esophagitis, or Mallory-Weiss tear. R/O esophageal varices in a long term alcoholic.I anticipate her going into alcohol withdrawal. Abdomen is protuberant but no ascites. Check ammonia level

## 2012-03-30 NOTE — ED Notes (Signed)
Nursing report given to Erskine Squibb, RN on [address]

## 2012-03-30 NOTE — ED Notes (Signed)
Selena Batten, RN from the ER called daughter and husband to inform them of pt's room assignment.

## 2012-03-30 NOTE — Care Management Note (Signed)
    Page 1 of 2   04/14/2012     1:10:13 PM   CARE MANAGEMENT NOTE 04/14/2012  Patient:  DOROTA, HEINRICHS   Account Number:  [id number]  Date Initiated:  03/30/2012  Documentation initiated by:  Colleen Can  Subjective/Objective Assessment:   dx GI bleed, tacycardia     Action/Plan:   CM will follow   Anticipated DC Date:  04/02/2012   Anticipated DC Plan:  HOME/SELF CARE  In-house referral  Financial Counselor      DC Planning Services  CM consult  Medication Assistance  Indigent [facility]      Choice offered to / List presented to:             Status of service:  Completed, signed off Medicare Important Message given?   (If response is "NO", the following Medicare IM given date fields will be blank) Date Medicare IM given:   Date Additional Medicare IM given:    Discharge Disposition:  HOME/SELF CARE  Per UR Regulation:  Reviewed for med. necessity/level of care/duration of stay  If discussed at Long Length of Stay Meetings, dates discussed:    Comments:  04/13/12 KATHYN Aftin Lye RN,BSN NCM [phone] RECEIVED CALL FROM Jenah ABOUT STILLHABING PROBLEMS GETTING MONEY FOR HER MEDICINES WHICH I INFORMED HER WERE NOT EXPENSIVE,ONE WAS POTASSIUM.ENCOURAGED HER TO ASK FAMILY/FRIENDS TO ASSIST,& THAT [facility]  ONLY CHARGED $4 FOR THAT MED.SHE WAS ENCOURAGED TO F/U W/HER PCP.SHE ALSO WANTED TO F/U ON HER FINANCIAL STATUS W/THE HOSPITAL,I PROVIED HER W/THE MAIN TEL#[phone] & TOLD HER TO ASK FOR THE FINANCIAL COUNSELING DEPT.SHE FELT HER CONCERNS WERE ADDRESSED.  04-06-12 Lorenda Ishihara RN CM 1500 Received a call from patient stating she is unable to afford her Carafate prescription. Asking if we could provide her with some free meds or provide an alternative. Encouraged her to contact [facility] as med would likely be cheaper there, encouraged her to discuss over the counter alternative with pharmacy. Contacted Dr. Irene Limbo who wrote script and he had not other suggestions.  Patient stated she was able to get PPI filled. She stated she would call some others for assistance and thanked me for my help.  04/02/12 Pavneet Markwood RN,BSN NCM [phone] EXPLAINED PROCESS OF RECEIVING INDIGENT FUNDS-1X USE OVER 64YR/ONLY 3 DAY SUPPLY/NO NARCOTICS.COMPREHENDED.WILL NEED 1 SET SCRIPT TO SEND TO OUR PHARMACY.SHELTER RESOURCES PROVIDED BY SW.MD UPDATED.  04/01/12 Jodeci Rini RN,BSN NCM [phone] PATIENT PREFERS TO USE INDIGENT FUNDS.WILL NEED 1 SET OF SCRIPT TO SEND TO OUR PHARMACY. 03/30/12 Tywana Robotham RN,BSN NCM [phone] PATIENT STATES SHE WILL STAY W/DTR @ D/C IN [place].PROVIDED W/$4 [facility] MED LIST.QUALIFIES FOR INDIGENT FUNDS IF NEEDED.PROVIDED W/[facility] FOR PCP,SHE MUST CALL @ D/C FOR NEW PATIENT REFERRAL PACKET TO BE SENT TO HER HOME,SHE MUST RETURN COMPLETED FORM,THEN THEY WILL SET UP PCP APPT,& SCREENING FOR ELIGIBILITY FOR ACCESS TO HEALTH CARE.THEIR FEE IS $20.ALSO PROVIDED PATIENT W/INFO FOR DSS-MEDICAID APPLICATION F/U.

## 2012-03-30 NOTE — Progress Notes (Signed)
Patient ID: Lisa Stuart, female   DOB: 1961-07-18, 51 y.o.   MRN: [id number]   300+ pages of records received from [facility] [facility]. Pt was last admitted 12/2011 and left AMA. Records show Abdominal Us-CBD dialtion to 1cm (was 4.53mm) and gallbladder sludge,fatty liver. MRI [name] 5/13 showsno gallstones,dilated CBD without filling defects,mild dialtion of pancreatic duct,mild pancreatitis involving tail of pancreas, and hepatomegaly. Pt went thru DT's during that admission.Plan was for f/u CT in 6-8 weeks  Several admissions in 2010-2013 all with Alcohol induced pancreatitis or hepatitis and withdrawal. Records will be scanned into notes section

## 2012-03-31 ENCOUNTER — Encounter (HOSPITAL_COMMUNITY): Payer: Self-pay | Admitting: *Deleted

## 2012-03-31 ENCOUNTER — Encounter (HOSPITAL_COMMUNITY)
Admission: EM | Disposition: A | Discharge status: Home / Self Care | Payer: Self-pay | Source: Home / Self Care | Attending: Family Medicine

## 2012-03-31 DIAGNOSIS — K311 Adult hypertrophic pyloric stenosis: Secondary | ICD-10-CM | POA: Insufficient documentation

## 2012-03-31 DIAGNOSIS — D649 Anemia, unspecified: Secondary | ICD-10-CM

## 2012-03-31 DIAGNOSIS — R1084 Generalized abdominal pain: Secondary | ICD-10-CM

## 2012-03-31 DIAGNOSIS — K920 Hematemesis: Secondary | ICD-10-CM | POA: Diagnosis present

## 2012-03-31 DIAGNOSIS — K2100 Gastro-esophageal reflux disease with esophagitis, without bleeding: Secondary | ICD-10-CM

## 2012-03-31 DIAGNOSIS — D696 Thrombocytopenia, unspecified: Secondary | ICD-10-CM

## 2012-03-31 DIAGNOSIS — F1010 Alcohol abuse, uncomplicated: Secondary | ICD-10-CM

## 2012-03-31 HISTORY — PX: ESOPHAGOGASTRODUODENOSCOPY: SHX5428

## 2012-03-31 LAB — BASIC METABOLIC PANEL
BUN: 3 mg/dL — ABNORMAL LOW (ref 6–23)
CO2: 25 mEq/L (ref 19–32)
Calcium: 8.9 mg/dL (ref 8.4–10.5)
Chloride: 100 mEq/L (ref 96–112)
Chloride: 100 mEq/L (ref 96–112)
Creatinine, Ser: 0.38 mg/dL — ABNORMAL LOW (ref 0.50–1.10)
GFR calc Af Amer: >90 mL/min (ref >90)
Glucose, Bld: 116 mg/dL — ABNORMAL HIGH (ref 70–99)
Potassium: 2.7 mEq/L — CL (ref 3.5–5.1)
Sodium: 135 mEq/L (ref 135–145)

## 2012-03-31 LAB — CBC
Platelets: 77 10*3/uL — ABNORMAL LOW (ref 150–400)
RBC: 3.17 MIL/uL — ABNORMAL LOW (ref 3.87–5.11)
WBC: 5.1 10*3/uL (ref 4.0–10.5)

## 2012-03-31 LAB — LIPASE, BLOOD: Lipase: 126 U/L — ABNORMAL HIGH (ref 11–59)

## 2012-03-31 SURGERY — EGD (ESOPHAGOGASTRODUODENOSCOPY)
Anesthesia: Moderate Sedation

## 2012-03-31 MED ORDER — DIPHENHYDRAMINE HCL 50 MG/ML IJ SOLN
INTRAMUSCULAR | Status: AC
  Filled 2012-03-31: qty 1

## 2012-03-31 MED ORDER — PANTOPRAZOLE SODIUM 40 MG PO TBEC
40.0000 mg | DELAYED_RELEASE_TABLET | Freq: Every day | ORAL | Status: DC
  Administered 2012-04-02: 40 mg via ORAL
  Filled 2012-03-31: qty 1

## 2012-03-31 MED ORDER — MENTHOL 3 MG MT LOZG
1.0000 | LOZENGE | OROMUCOSAL | Status: DC | PRN
  Filled 2012-03-31: qty 9

## 2012-03-31 MED ORDER — MORPHINE SULFATE 2 MG/ML IJ SOLN
2.0000 mg | INTRAMUSCULAR | Status: DC | PRN
  Administered 2012-03-31 – 2012-04-02 (×10): 2 mg via INTRAVENOUS
  Filled 2012-03-31 (×10): qty 1

## 2012-03-31 MED ORDER — MIDAZOLAM HCL 10 MG/2ML IJ SOLN
INTRAMUSCULAR | Status: DC | PRN
  Administered 2012-03-31 (×3): 2 mg via INTRAVENOUS

## 2012-03-31 MED ORDER — MAGNESIUM SULFATE 40 MG/ML IJ SOLN
2.0000 g | Freq: Once | INTRAMUSCULAR | Status: AC
  Administered 2012-03-31: 2 g via INTRAVENOUS
  Filled 2012-03-31: qty 50

## 2012-03-31 MED ORDER — POTASSIUM CHLORIDE 10 MEQ/100ML IV SOLN
10.0000 meq | INTRAVENOUS | Status: AC
  Administered 2012-03-31 (×4): 10 meq via INTRAVENOUS
  Filled 2012-03-31 (×4): qty 100

## 2012-03-31 MED ORDER — FENTANYL CITRATE 0.05 MG/ML IJ SOLN
INTRAMUSCULAR | Status: AC
  Filled 2012-03-31: qty 4

## 2012-03-31 MED ORDER — DIPHENHYDRAMINE HCL 50 MG/ML IJ SOLN
INTRAMUSCULAR | Status: DC | PRN
  Administered 2012-03-31 (×2): 25 mg via INTRAVENOUS

## 2012-03-31 MED ORDER — FENTANYL CITRATE 0.05 MG/ML IJ SOLN
INTRAMUSCULAR | Status: DC | PRN
  Administered 2012-03-31 (×3): 25 ug via INTRAVENOUS

## 2012-03-31 MED ORDER — BUTAMBEN-TETRACAINE-BENZOCAINE 2-2-14 % EX AERO
INHALATION_SPRAY | CUTANEOUS | Status: DC | PRN
  Administered 2012-03-31: 2 via TOPICAL

## 2012-03-31 MED ORDER — MIDAZOLAM HCL 10 MG/2ML IJ SOLN
INTRAMUSCULAR | Status: AC
  Filled 2012-03-31: qty 4

## 2012-03-31 MED ORDER — POTASSIUM CHLORIDE CRYS ER 20 MEQ PO TBCR
40.0000 meq | EXTENDED_RELEASE_TABLET | Freq: Three times a day (TID) | ORAL | Status: DC
  Administered 2012-03-31 – 2012-04-02 (×5): 40 meq via ORAL
  Filled 2012-03-31 (×7): qty 2

## 2012-03-31 NOTE — Op Note (Signed)
[facility] [address] [place], [place]  [address]  ENDOSCOPY PROCEDURE REPORT  PATIENT:  Lisa, Stuart  MR#:  [id number] BIRTHDATE:  [birth year], 50 yrs. old  GENDER:  female  ENDOSCOPIST:  Hedwig Morton. Juanda Chance, MD Referred by:  PROCEDURE DATE:  03/31/2012 PROCEDURE:  EGD with biopsy, 43239, EGD with balloon dilatation ASA CLASS:  Class III INDICATIONS:  hematemesis hx alcohol related liver disease  MEDICATIONS:   These medications were titrated to patient response per physician's verbal order, Versed 6 mg, Fentanyl 75 mcg, Benadryl 50 mg TOPICAL ANESTHETIC:  Cetacaine Spray  DESCRIPTION OF PROCEDURE:   After the risks benefits and alternatives of the procedure were thoroughly explained, informed consent was obtained.  The [id number] endoscope was introduced through the mouth and advanced to the second portion of the duodenum, without limitations.  The instrument was slowly withdrawn as the mucosa was fully examined. <<PROCEDUREIMAGES>>  pyloric stenosis. unable to advance the scope through the pylorus, spam and edema Balloon dilatation was performed (see image3, image4, image5, and image7). 15 mm BS balloon iflated to 15 mm x 1 minute  Moderate gastritis was found. antral edema, large folds extending into the pylorus With standard forceps, a biopsy was obtained and sent to pathology (see image5 and image3). Esophagitis was found in the distal esophagus (see image2, image1, and image9). Grade 2 esophagitis- linear erosions =5 mm, also increased fibrous tissue c/w nonobstructing stricture  Otherwise the examination was normal (see image10 and image8). no varices Retroflexed views revealed no abnormalities.    The scope was then withdrawn from the patient and the procedure completed. COMPLICATIONS:  None  ENDOSCOPIC IMPRESSION: 1) Pyloric stenosis 2) Moderate gastritis 3) Esophagitis in the distal esophagus 4) Otherwise normal examination hematemesis due to  esophageal erosions, no active bleeding, no esophageal varices spasm/edema pylorus,dilated with 15 mm balloon RECOMMENDATIONS: 1) Await biopsy results advance diet,PPI,  REPEAT EXAM:  In 0 year(s) for.  ______________________________ Hedwig Morton. Juanda Chance, MD  CC:  n. eSIGNED:   Hedwig Morton. Dan Scearce at 03/31/2012 11:49 AM  Lisa Stuart, [id number]

## 2012-03-31 NOTE — Interval H&P Note (Signed)
History and Physical Interval Note:  03/31/2012 11:12 AM  Lisa Stuart  has presented today for surgery, with the diagnosis of coffee ground emesis  The various methods of treatment have been discussed with the patient and family. After consideration of risks, benefits and other options for treatment, the patient has consented to  Procedure(s) (LRB): ESOPHAGOGASTRODUODENOSCOPY (EGD) (N/A) as a surgical intervention .  The patient's history has been reviewed, patient examined, no change in status, stable for surgery.  I have reviewed the patient's chart and labs.  Questions were answered to the patient's satisfaction.     Lina Sar

## 2012-03-31 NOTE — Progress Notes (Signed)
CRITICAL VALUE ALERT  Critical value received:  K 2.7  Date of notification:  08/07  Time of notification:  0523  Critical value read back:yes  Nurse who received alert:  Matthew Saras  MD notified (1st page):  Craige Cotta  Time of first page:  0525  MD notified (2nd page):  Time of second page:  Responding MD:  Craige Cotta  Time MD responded:  [phone]

## 2012-03-31 NOTE — Progress Notes (Signed)
[facility] PROGRESS NOTE  Lisa Stuart ZOX:[id number] DOB: 03/03/1961 DOA: 03/29/2012 PCP: No primary provider on file.  Assessment/Plan: 1. Hematemesis/GIB: Secondary to esophageal erosions. Continue PPI. Appreciate GI evaluation and recommendations.  2. Generalized abdominal pain: Benign exam. Suspect secondary to alcohol-induced gastritis/esophagitis. 3. Normocytic anemia: Likely dilutional--stable. Component of acute blood loss possible. 4. Thrombocytopenia: Monitor--suspect secondary to alcohol abuse. CBC in AM.  5. Alcohol withdrawal: Appears stable. 6. Hypokalemia: Replete. 7. Hypomagnesemia: Replete. Labs in AM. 8. Alcohol abuse: CSW consult. 9. History of pancreatitis  Code Status: full code Family Communication: none at bedside Disposition Plan: home when improved  Brendia Sacks, MD  [facility] Team 4 Pager [phone]. If 8PM-8AM, please contact night-coverage at [email or web address], password [facility] 03/31/2012, 3:13 PM  LOS: 2 days   Brief narrative: 51 year old female with history off alcoholism, alcoholic pancreatitis, gallstones who is new to [place] and previously had her care at [facility] came with 4 episodes of hematemesis and dark tarry stools since yesterday. Patient noted to have a hemoglobin of 11 on presentation (baseline not normal) in the setting off active alcohol use. She was also noted to be tremulous with concern for early alcohol withdrawal on presentation.  300+ pages of records received from [facility]. Pt was last admitted 12/2011 and left AMA. Records show Abdominal Us-CBD dilitation to 1cm (was 4.55mm) and gallbladder sludge,fatty liver. MRI [name] 5/13 showsno gallstones,dilated CBD without filling defects,mild dialtion of pancreatic duct,mild pancreatitis involving tail of pancreas, and hepatomegaly. Pt went thru DT's during that admission.Plan was for f/u CT in 6-8 weeks. Several admissions in 2010-2013 all with Alcohol induced pancreatitis  or hepatitis and withdrawal.  Consultants:  [facility]  Procedures:  EGD--pyloric stenosis, moderate gastritis, esophagitis in the distal esophagus. Hematemesis due to esophageal erosions.  HPI/Subjective: Complains of abdominal pain.  Objective: Filed Vitals:   03/31/12 1210 03/31/12 1220 03/31/12 1230 03/31/12 1300  BP: 126/81 123/83 112/73 116/77  Pulse:    84  Temp:    97.2 F (36.2 C)  TempSrc:    Oral  Resp: 15 13 13 15   Height:      Weight:      SpO2: 100% 100% 100% 100%    Intake/Output Summary (Last 24 hours) at 03/31/12 1513 Last data filed at 03/31/12 1300  Gross per 24 hour  Intake   1500 ml  Output    650 ml  Net    850 ml   Wt Readings from Last 3 Encounters:  03/30/12 46.2 kg (101 lb 13.6 oz)  03/30/12 46.2 kg (101 lb 13.6 oz)    Exam:   General:  Appears calm and comfortable.  Cardiovascular: RRR, no m/r/g. No LE edema.  Respiratory: CTA bilaterally, no w/r/r. Normal respiratory effort.  Abdomen: Soft, ntnd  Data Reviewed: Basic Metabolic Panel:  Lab 03/31/12 8295 03/30/12 0355 03/29/12 2125  NA 135 138 134*  K 2.7* 3.8 3.7  CL 100 106 95*  CO2 25 17* 14*  GLUCOSE 110* 120* 121*  BUN 3* 11 14  CREATININE 0.42* 0.60 0.63  CALCIUM 8.9 8.6 9.6  MG 1.0* -- --  PHOS -- -- --   Liver Function Tests:  Lab 03/30/12 0355 03/29/12 2125  AST 72* 89*  ALT 28 34  ALKPHOS 110 135*  BILITOT 1.1 1.3*  PROT 6.8 8.2  ALBUMIN 3.6 4.5    Lab 03/31/12 0405 03/29/12 2125  LIPASE 126* 157*  AMYLASE -- --    Lab 03/30/12 1149  AMMONIA 53  CBC:  Lab 03/31/12 0405 03/30/12 0355 03/29/12 2125  WBC 5.1 4.7 6.3  NEUTROABS -- -- 3.8  HGB 9.9* 9.2* 11.1*  HCT 28.9* 27.4* 32.6*  MCV 91.2 92.3 92.4  PLT 77* 88* 97*   Studies: Ct Head Wo Contrast  03/30/2012  *RADIOLOGY REPORT*  Clinical Data: Weakness.  Dizziness.  Nausea and vomiting. Shortness of breath.  CT HEAD WITHOUT CONTRAST  Technique:  Contiguous axial images were obtained from  the base of the skull through the vertex without contrast.  Comparison: None.  Findings: Mild cerebral atrophy.  No mass effect or midline shift. No abnormal extra-axial fluid collections.  Gray-white matter junctions are distinct.  No effacement of basal cisterns.  No evidence of acute intracranial hemorrhage.  No ventricular dilatation.  Mild vascular calcifications.  Visualized paranasal sinuses and mastoid air cells are not opacified.  No depressed skull fractures.  IMPRESSION: No acute intracranial abnormalities.  Original Report Authenticated By: Marlon Pel, M.D.   Dg Abd Acute W/chest  03/29/2012  *RADIOLOGY REPORT*  Clinical Data: Abdominal pain.  Hematemesis.  ACUTE ABDOMEN SERIES (ABDOMEN 2 VIEW & CHEST 1 VIEW)  Comparison: None.  Findings: Normal heart size and pulmonary vascularity.  No focal consolidation in the lungs.  No blunting of costophrenic angles.  Scattered gas and stool in the colon.  No small or large bowel dilatation.  No free intra-abdominal air.  No abnormal air fluid levels.  No radiopaque stones.  IMPRESSION: No evidence of active pulmonary disease.  Nonobstructive bowel gas pattern.  Original Report Authenticated By: Marlon Pel, M.D.  Scheduled Meds:   . sodium chloride  1,000 mL Intravenous Once  . dextrose 5 % and 0.45% NaCl   Intravenous STAT  . folic acid  1 mg Oral Daily  .  morphine injection  1 mg Intravenous NOW  . multivitamin with minerals  1 tablet Oral Daily  . pantoprazole (PROTONIX) IV  40 mg Intravenous Q12H  . potassium chloride  10 mEq Intravenous Q1 Hr x 4  . sucralfate  1 g Oral TID WC & HS  . thiamine  100 mg Oral Daily   Or  . thiamine  100 mg Intravenous Daily  . DISCONTD:  morphine injection  1 mg Intravenous Q6H   Continuous Infusions:   . sodium chloride 125 mL (03/30/12 1903)    Principal Problem:  *GI bleed Active Problems:  Alcohol abuse  Hematemesis  Reflux esophagitis  Generalized abdominal pain  Normocytic  anemia  Thrombocytopenia  Hypomagnesemia     Brendia Sacks, MD  [facility] Team 4 Pager [phone]. If 8PM-8AM, please contact night-coverage at [email or web address], password [facility] 03/31/2012, 3:13 PM  LOS: 2 days   Time spent: 25 minutes

## 2012-04-01 ENCOUNTER — Encounter (HOSPITAL_COMMUNITY): Payer: Self-pay

## 2012-04-01 ENCOUNTER — Encounter (HOSPITAL_COMMUNITY): Payer: Self-pay | Admitting: Internal Medicine

## 2012-04-01 LAB — CBC
Hemoglobin: 8.6 g/dL — ABNORMAL LOW (ref 12.0–15.0)
MCH: 31.4 pg (ref 26.0–34.0)
MCHC: 33.6 g/dL (ref 30.0–36.0)
MCV: 93.4 fL (ref 78.0–100.0)

## 2012-04-01 LAB — BASIC METABOLIC PANEL
BUN: <3 mg/dL — ABNORMAL LOW (ref 6–23)
Calcium: 8.5 mg/dL (ref 8.4–10.5)
Creatinine, Ser: 0.37 mg/dL — ABNORMAL LOW (ref 0.50–1.10)
GFR calc non Af Amer: >90 mL/min (ref >90)
Glucose, Bld: 115 mg/dL — ABNORMAL HIGH (ref 70–99)
Sodium: 136 mEq/L (ref 135–145)

## 2012-04-01 MED ORDER — MAGNESIUM SULFATE 40 MG/ML IJ SOLN
2.0000 g | Freq: Once | INTRAMUSCULAR | Status: AC
  Administered 2012-04-01: 2 g via INTRAVENOUS
  Filled 2012-04-01: qty 50

## 2012-04-01 MED ORDER — PROMETHAZINE HCL 25 MG/ML IJ SOLN
12.5000 mg | Freq: Once | INTRAMUSCULAR | Status: AC
  Administered 2012-04-01: 12.5 mg via INTRAVENOUS
  Filled 2012-04-01: qty 1

## 2012-04-01 NOTE — Progress Notes (Signed)
Subjective I feel sick,... distention, epig.pain  Objective:post EGS which showed reflux esophagitis and partial gastric outlet obstruction due to edema and spasm Vital signs in last 24 hours: Temp:  [97.2 F (36.2 C)-98.7 F (37.1 C)] 97.8 F (36.6 C) (08/08 0600) Pulse Rate:  [84-104] 88  (08/08 0600) Resp:  [11-25] 16  (08/08 0600) BP: (98-154)/(64-93) 123/84 mmHg (08/08 0600) SpO2:  [94 %-100 %] 99 % (08/08 0600)   General:   Alert,  pleasant, cooperative in NAD Head:  Normocephalic and atraumatic. Eyes:  Sclera clear, no icterus.   Conjunctiva pink. Mouth:  No deformity or lesions, dentition normal. Neck:  Supple; no masses or thyromegaly. Heart:  Regular rate and rhythm; no murmurs, clicks, rubs,  or gallops. Lungs:  No wheezes or rales Abdomen:  Mildly distended, positive bowl sounds, tender but no rebound, no ascites  Msk:  Symmetrical without gross deformities. Normal posture. Pulses:  Normal pulses noted. Extremities:  Without clubbing or edema. Neurologic:  Alert and  oriented x4;  grossly normal neurologically. No asterixis Skin:  Intact without significant lesions or rashes.  Intake/Output from previous day: 08/07 0701 - 08/08 0700 In: 1472.9 [I.V.:1472.9] Out: 1600 [Urine:1600] Intake/Output this shift:    Lab Results:  Basename 04/01/12 0400 03/31/12 0405 03/30/12 0355  WBC 3.7* 5.1 4.7  HGB 8.6* 9.9* 9.2*  HCT 25.6* 28.9* 27.4*  PLT 62* 77* 88*   BMET  Basename 04/01/12 0400 03/31/12 1450 03/31/12 0405  NA 136 136 135  K 3.1* 3.0* 2.7*  CL 103 100 100  CO2 24 26 25   GLUCOSE 115* 116* 110*  BUN <3* 3* 3*  CREATININE 0.37* 0.38* 0.42*  CALCIUM 8.5 8.6 8.9   LFT  Basename 03/30/12 0355  PROT 6.8  ALBUMIN 3.6  AST 72*  ALT 28  ALKPHOS 110  BILITOT 1.1  BILIDIR --  IBILI --   PT/INR  Basename 03/30/12 1150  LABPROT 14.2  INR 1.08   Hepatitis Panel No results found for this basename: HEPBSAG,HCVAB,HEPAIGM,HEPBIGM in the last 72  hours  Studies/Results: No results found.   ASSESSMENT:   Principal Problem:  *GI bleed Active Problems:  Alcohol abuse  Hematemesis  Reflux esophagitis  Generalized abdominal pain  Normocytic anemia  Thrombocytopenia  Hypomagnesemia     PLAN:   Gastritis with pyloric edema and partial obstruction, Would keep on liquids and obtain UGI series tomorrow before advancing to solid food,. I think the GOO is reversible once the edema subsides. Continue PPI, There were no Varices.     LOS: 3 days   Lisa Stuart  04/01/2012, 8:13 AM

## 2012-04-01 NOTE — Progress Notes (Signed)
[facility] PROGRESS NOTE  Calle Schader EAV:[id number] DOB: 06-25-1961 DOA: 03/29/2012 PCP: No primary provider on file.  Assessment/Plan: 1. Hematemesis/GIB: No further bleeding, hemoglobin stalbe. Secondary to esophageal erosions, gastritis. Continue PPI. Appreciate GI evaluation and recommendations.  2. Partial GOO: per GI. UGI series planned 8/9. 3. Generalized abdominal pain: Benign exam. Suspect secondary to alcohol-induced gastritis/esophagitis. 4. Normocytic anemia: Likely dilutional but trending downwards. Check CBC in AM. Component of acute blood loss possible. 5. Thrombocytopenia: Monitor--suspect secondary to alcohol abuse. CBC in AM.  6. Alcohol withdrawal: Appears stable. 7. Hypokalemia: Replete. 8. Hypomagnesemia: Replete. Labs in AM. 9. Alcohol abuse: CSW consult. 10. History of pancreatitis  Code Status: full code Family Communication: none at bedside Disposition Plan: home when improved  Brendia Sacks, MD  [facility] Team 4 Pager [phone]. If 8PM-8AM, please contact night-coverage at [email or web address], password [facility] 04/01/2012, 6:31 PM  LOS: 3 days   Brief narrative: 51 year old female with history off alcoholism, alcoholic pancreatitis, gallstones who is new to [place] and previously had her care at [facility] came with 4 episodes of hematemesis and dark tarry stools since yesterday. Patient noted to have a hemoglobin of 11 on presentation (baseline not normal) in the setting off active alcohol use. She was also noted to be tremulous with concern for early alcohol withdrawal on presentation.  300+ pages of records received from [facility]. Pt was last admitted 12/2011 and left AMA. Records show Abdominal Us-CBD dilitation to 1cm (was 4.24mm) and gallbladder sludge,fatty liver. MRI [name] 5/13 showsno gallstones,dilated CBD without filling defects,mild dialtion of pancreatic duct,mild pancreatitis involving tail of pancreas, and hepatomegaly. Pt went  thru DT's during that admission.Plan was for f/u CT in 6-8 weeks. Several admissions in 2010-2013 all with Alcohol induced pancreatitis or hepatitis and withdrawal.  Consultants:   GI  Procedures:  EGD--pyloric stenosis, moderate gastritis, esophagitis in the distal esophagus. Hematemesis due to esophageal erosions.  HPI/Subjective: Feels overall better. Still has nausea.  Objective: Filed Vitals:   03/31/12 1300 03/31/12 2206 04/01/12 0600 04/01/12 1500  BP: 116/77 117/72 123/84 120/79  Pulse: 84 87 88 85  Temp: 97.2 F (36.2 C) 97.9 F (36.6 C) 97.8 F (36.6 C) 98.4 F (36.9 C)  TempSrc: Oral Oral Oral Oral  Resp: 15 16 16 16   Height:      Weight:      SpO2: 100% 100% 99% 99%    Intake/Output Summary (Last 24 hours) at 04/01/12 1831 Last data filed at 04/01/12 1700  Gross per 24 hour  Intake 1482.92 ml  Output   2500 ml  Net -1017.08 ml   Wt Readings from Last 3 Encounters:  03/30/12 46.2 kg (101 lb 13.6 oz)  03/30/12 46.2 kg (101 lb 13.6 oz)    Exam:   General:  Appears calm and comfortable.  Cardiovascular: RRR, no m/r/g. No LE edema.  Respiratory: CTA bilaterally, no w/r/r. Normal respiratory effort.  Abdomen: Soft, ntnd  Data Reviewed: Basic Metabolic Panel:  Lab 04/01/12 1308 03/31/12 1450 03/31/12 0405 03/30/12 0355 03/29/12 2125  NA 136 136 135 138 134*  K 3.1* 3.0* 2.7* 3.8 3.7  CL 103 100 100 106 95*  CO2 24 26 25  17* 14*  GLUCOSE 115* 116* 110* 120* 121*  BUN <3* 3* 3* 11 14  CREATININE 0.37* 0.38* 0.42* 0.60 0.63  CALCIUM 8.5 8.6 8.9 8.6 9.6  MG 1.4* -- 1.0* -- --  PHOS -- -- -- -- --   Liver Function Tests:  Lab 03/30/12 0355 03/29/12  2125  AST 72* 89*  ALT 28 34  ALKPHOS 110 135*  BILITOT 1.1 1.3*  PROT 6.8 8.2  ALBUMIN 3.6 4.5    Lab 03/31/12 0405 03/29/12 2125  LIPASE 126* 157*  AMYLASE -- --    Lab 03/30/12 1149  AMMONIA 53   CBC:  Lab 04/01/12 0400 03/31/12 0405 03/30/12 0355 03/29/12 2125  WBC 3.7*  5.1 4.7 6.3  NEUTROABS -- -- -- 3.8  HGB 8.6* 9.9* 9.2* 11.1*  HCT 25.6* 28.9* 27.4* 32.6*  MCV 93.4 91.2 92.3 92.4  PLT 62* 77* 88* 97*   Studies: Ct Head Wo Contrast  03/30/2012  *RADIOLOGY REPORT*  Clinical Data: Weakness.  Dizziness.  Nausea and vomiting. Shortness of breath.  CT HEAD WITHOUT CONTRAST  Technique:  Contiguous axial images were obtained from the base of the skull through the vertex without contrast.  Comparison: None.  Findings: Mild cerebral atrophy.  No mass effect or midline shift. No abnormal extra-axial fluid collections.  Gray-white matter junctions are distinct.  No effacement of basal cisterns.  No evidence of acute intracranial hemorrhage.  No ventricular dilatation.  Mild vascular calcifications.  Visualized paranasal sinuses and mastoid air cells are not opacified.  No depressed skull fractures.  IMPRESSION: No acute intracranial abnormalities.  Original Report Authenticated By: Marlon Pel, M.D.   Dg Abd Acute W/chest  03/29/2012  *RADIOLOGY REPORT*  Clinical Data: Abdominal pain.  Hematemesis.  ACUTE ABDOMEN SERIES (ABDOMEN 2 VIEW & CHEST 1 VIEW)  Comparison: None.  Findings: Normal heart size and pulmonary vascularity.  No focal consolidation in the lungs.  No blunting of costophrenic angles.  Scattered gas and stool in the colon.  No small or large bowel dilatation.  No free intra-abdominal air.  No abnormal air fluid levels.  No radiopaque stones.  IMPRESSION: No evidence of active pulmonary disease.  Nonobstructive bowel gas pattern.  Original Report Authenticated By: Marlon Pel, M.D.  Scheduled Meds:    . folic acid  1 mg Oral Daily  . magnesium sulfate 1 - 4 g bolus IVPB  2 g Intravenous Once  . magnesium sulfate 1 - 4 g bolus IVPB  2 g Intravenous Once  . multivitamin with minerals  1 tablet Oral Daily  . pantoprazole  40 mg Oral Q1200  . potassium chloride  40 mEq Oral TID  . promethazine  12.5 mg Intravenous Once  . sucralfate  1 g Oral  TID WC & HS  . thiamine  100 mg Oral Daily   Or  . thiamine  100 mg Intravenous Daily  . DISCONTD: sodium chloride  1,000 mL Intravenous Once   Continuous Infusions:    . DISCONTD: sodium chloride 125 mL (03/30/12 1903)    Principal Problem:  *GI bleed Active Problems:  Alcohol abuse  Hematemesis  Reflux esophagitis  Generalized abdominal pain  Normocytic anemia  Thrombocytopenia  Hypomagnesemia     Brendia Sacks, MD  [facility] Team 4 Pager [phone]. If 8PM-8AM, please contact night-coverage at [email or web address], password [facility] 04/01/2012, 6:31 PM  LOS: 3 days   Time spent: 20 minutes

## 2012-04-02 ENCOUNTER — Inpatient Hospital Stay (HOSPITAL_COMMUNITY): Payer: MEDICAID

## 2012-04-02 DIAGNOSIS — K920 Hematemesis: Secondary | ICD-10-CM

## 2012-04-02 DIAGNOSIS — K311 Adult hypertrophic pyloric stenosis: Secondary | ICD-10-CM

## 2012-04-02 DIAGNOSIS — R109 Unspecified abdominal pain: Secondary | ICD-10-CM

## 2012-04-02 LAB — BASIC METABOLIC PANEL
BUN: <3 mg/dL — ABNORMAL LOW (ref 6–23)
Calcium: 9.3 mg/dL (ref 8.4–10.5)
Creatinine, Ser: 0.41 mg/dL — ABNORMAL LOW (ref 0.50–1.10)
GFR calc Af Amer: >90 mL/min (ref >90)
GFR calc non Af Amer: >90 mL/min (ref >90)

## 2012-04-02 LAB — CBC
HCT: 29.1 % — ABNORMAL LOW (ref 36.0–46.0)
MCH: 31.1 pg (ref 26.0–34.0)
MCHC: 33 g/dL (ref 30.0–36.0)
MCV: 94.2 fL (ref 78.0–100.0)
Platelets: 69 10*3/uL — ABNORMAL LOW (ref 150–400)
RDW: 14.7 % (ref 11.5–15.5)
WBC: 4.3 10*3/uL (ref 4.0–10.5)

## 2012-04-02 LAB — MAGNESIUM: Magnesium: 1.3 mg/dL — ABNORMAL LOW (ref 1.5–2.5)

## 2012-04-02 MED ORDER — ADULT MULTIVITAMIN W/MINERALS CH: 1.0000 | ORAL_TABLET | Freq: Every day | ORAL | Status: AC

## 2012-04-02 MED ORDER — MAGNESIUM SULFATE 40 MG/ML IJ SOLN
2.0000 g | Freq: Once | INTRAMUSCULAR | Status: AC
  Administered 2012-04-02: 2 g via INTRAVENOUS
  Filled 2012-04-02: qty 50

## 2012-04-02 MED ORDER — SUCRALFATE 1 GM/10ML PO SUSP: 1.0000 g | Freq: Three times a day (TID) | ORAL | Status: DC

## 2012-04-02 MED ORDER — MAGNESIUM OXIDE 400 (241.3 MG) MG PO TABS: 400.0000 mg | ORAL_TABLET | Freq: Two times a day (BID) | ORAL | Status: DC

## 2012-04-02 MED ORDER — THIAMINE HCL 100 MG PO TABS: 100.0000 mg | ORAL_TABLET | Freq: Every day | ORAL | Status: AC

## 2012-04-02 MED ORDER — POTASSIUM CHLORIDE CRYS ER 20 MEQ PO TBCR: 40.0000 meq | EXTENDED_RELEASE_TABLET | Freq: Every day | ORAL | Status: DC

## 2012-04-02 MED ORDER — OMEPRAZOLE 40 MG PO CPDR: 40.0000 mg | DELAYED_RELEASE_CAPSULE | Freq: Every day | ORAL | Status: DC

## 2012-04-02 MED ORDER — HYDROCODONE-ACETAMINOPHEN 5-325 MG PO TABS: 1.0000 | ORAL_TABLET | Freq: Four times a day (QID) | ORAL | Status: AC | PRN

## 2012-04-02 MED ORDER — MAGNESIUM OXIDE 400 (241.3 MG) MG PO TABS: 400.0000 mg | ORAL_TABLET | Freq: Two times a day (BID) | ORAL | Status: AC

## 2012-04-02 MED ORDER — FOLIC ACID 1 MG PO TABS: 1.0000 mg | ORAL_TABLET | Freq: Every day | ORAL | Status: AC

## 2012-04-02 MED ORDER — ZOLPIDEM TARTRATE 5 MG PO TABS
5.0000 mg | ORAL_TABLET | Freq: Once | ORAL | Status: AC
  Administered 2012-04-02: 5 mg via ORAL
  Filled 2012-04-02: qty 1

## 2012-04-02 NOTE — Discharge Summary (Signed)
Physician Discharge Summary  Lisa Stuart EAV:[id number] DOB: [birth year] DOA: 03/29/2012  PCP: No primary provider on file.  Admit date: 03/29/2012 Discharge date: 04/02/2012  Recommendations for Outpatient Follow-up:  1. Follow-up gastritis, esophageal erosions 2. Recommend continued alcohol abstinence   Follow-up Information    Call [facility]. (CALL @ D/C FOR NEW PATIENT PACKET $20,)    Contact information:   [address] [place] [address] [phone]      Follow up with Lina Sar, MD. Call in 3 days. (Make appointment for 1 month)    Contact information:   [address] [facility] [address] [place] [address] [place] [address] [phone]         Discharge Diagnoses:  1. UGIB, hematemesis 2. Esophageal erosions, gastritis 3. Generalized abdominal pain 4. Normocytic anemia 5. Thrombocytopenia 6. Alcohol withdrawal 7. Hypokalemia 8. Hypomagnesemia 9. Alcohol abuse 10. History of pancreatitis  Discharge Condition: improved Disposition: home  Diet recommendation: bland, low-fat diet  History of present illness:  51 year old woman with history off alcoholism, alcoholic pancreatitis, gallstones presented with 4 episodes of hematemesis and dark tarry stools since yesterday in the setting off active alcohol use. She was also noted to be tremulous with concern for early alcohol withdrawal on presentation.  [name] was last admitted 12/2011 and left AMA with discharge diagnoses acute pancreatitis, delirium tremens, pancreatic phlegmon, hepatic encephalopathy, alcohol abuse, anemia, thrombocytopenia, hypomagnesemia. Records show Abdominal Us-CBD dilitation to 1cm (was 4.23mm) and gallbladder sludge,fatty liver. MRI [name] 5/13 showsno gallstones,dilated CBD without filling defects,mild dialtion of pancreatic duct,mild pancreatitis involving tail of pancreas, and hepatomegaly. Pt went thru DT's during that  admission.Plan was for f/u CT in 6-8 weeks. Several admissions in 2010-2013 all with Alcohol induced pancreatitis or hepatitis and withdrawal.  Hospital Course:  [name] was admitted for UGIB. She was seen by GI and underwent EGD with results as below. UGI was unremarkable. Records reviewed as above. PPI, carafate recommended and cleared for discharge by GI. Anemia, thrombocytopenia stable, related to alcohol use. Mild alcohol withdrawal resolved with supportive care. Other issues as below. 1. Hematemesis/GIB: No further bleeding, hemoglobin stable. Secondary to esophageal erosions, gastritis. Continue PPI, carafate. Appreciate GI evaluation and recommendations.    2. Partial GOO: Resolved. UGI series unremarkable.  3. Generalized abdominal pain: Resolved. Suspect secondary to alcohol-induced gastritis/esophagitis.  4. Normocytic anemia: Stable.   5. Thrombocytopenia: Stable--suspect secondary to alcohol abuse. 6. Alcohol withdrawal: Appears stable.  7. Hypokalemia: Repleted.  8. Hypomagnesemia: Replete.    9. Alcohol abuse: Recommend cessation. 10. History of pancreatitis  Consultants:  [facility]  Procedures:  EGD--pyloric stenosis, moderate gastritis, esophagitis in the distal esophagus. Hematemesis due to esophageal erosions.  Discharge Instructions  Discharge Orders    Future Orders Please Complete By Expires   Diet general      Discharge instructions      Comments:   Low-fat diet. No alcohol!   Activity as tolerated - No restrictions        Medication List  As of 04/02/2012  3:52 PM   TAKE these medications         folic acid 1 MG tablet   Commonly known as: FOLVITE   Take 1 tablet (1 mg total) by mouth daily.      HYDROcodone-acetaminophen 5-325 MG per tablet   Commonly known as: NORCO/VICODIN   Take 1 tablet by mouth every 6 (six) hours as needed for  pain.      magnesium oxide 400 (241.3 MG) MG tablet   Commonly known as: MAG-OX   Take 1 tablet (400 mg total)  by mouth 2 (two) times daily.      multivitamin with minerals Tabs   Take 1 tablet by mouth daily.      omeprazole 40 MG capsule   Commonly known as: PRILOSEC   Take 1 capsule (40 mg total) by mouth daily.      potassium chloride SA 20 MEQ tablet   Commonly known as: K-DUR,KLOR-CON   Take 2 tablets (40 mEq total) by mouth daily.      sucralfate 1 GM/10ML suspension   Commonly known as: CARAFATE   Take 10 mLs (1 g total) by mouth 4 (four) times daily -  with meals and at bedtime.      thiamine 100 MG tablet   Take 1 tablet (100 mg total) by mouth daily.           The results of significant diagnostics from this hospitalization (including imaging, microbiology, ancillary and laboratory) are listed below for reference.    Significant Diagnostic Studies: Ct Head Wo Contrast  03/30/2012  *RADIOLOGY REPORT*  Clinical Data: Weakness.  Dizziness.  Nausea and vomiting. Shortness of breath.  CT HEAD WITHOUT CONTRAST  Technique:  Contiguous axial images were obtained from the base of the skull through the vertex without contrast.  Comparison: None.  Findings: Mild cerebral atrophy.  No mass effect or midline shift. No abnormal extra-axial fluid collections.  Gray-white matter junctions are distinct.  No effacement of basal cisterns.  No evidence of acute intracranial hemorrhage.  No ventricular dilatation.  Mild vascular calcifications.  Visualized paranasal sinuses and mastoid air cells are not opacified.  No depressed skull fractures.  IMPRESSION: No acute intracranial abnormalities.  Original Report Authenticated By: Marlon Pel, M.D.   Varney Biles [name]  04/02/2012  *RADIOLOGY REPORT*  Clinical Data:  51 year old female with a history of pyloric stenosis status post endoscopic balloon dilatation to 15 mm, and distal esophagitis and gastritis thought secondary to F name deis. To the right  UPPER GI SERIES WITH KUB  Technique:  Routine upper GI series was performed with thick,  and thin barium with air for double contrast.  Fluoroscopy Time: 1.41 minutes  Comparison:  Acute abdominal series 03/29/2012  Findings: Normal esophageal motility.  No stricture, or mucosal irregularity.  Normal passage of contrast material through the pylorus, no delayed gastric emptying.  No hiatal hernia. Gastric mucosa appears within normal limits.  No polyp, or mass identified. The ligament of Treitz is in normal position.  IMPRESSION: Unremarkable upper GI study.  Specifically, no evidence of residual pyloric stenosis, or delayed gastric emptying.  Original Report Authenticated By: HEATH   Labs: Basic Metabolic Panel:  Lab 04/02/12 1610 04/01/12 0400 03/31/12 1450 03/31/12 0405 03/30/12 0355  NA 135 136 136 135 138  K 4.0 3.1* 3.0* 2.7* 3.8  CL 100 103 100 100 106  CO2 27 24 26 25  17*  GLUCOSE 110* 115* 116* 110* 120*  BUN <3* <3* 3* 3* 11  CREATININE 0.41* 0.37* 0.38* 0.42* 0.60  CALCIUM 9.3 8.5 8.6 8.9 8.6  MG 1.3* 1.4* -- 1.0* --  PHOS -- -- -- -- --   Liver Function Tests:  Lab 03/30/12 0355 03/29/12 2125  AST 72* 89*  ALT 28 34  ALKPHOS 110 135*  BILITOT 1.1 1.3*  PROT 6.8 8.2  ALBUMIN 3.6 4.5  Lab 03/31/12 0405 03/29/12 2125  LIPASE 126* 157*  AMYLASE -- --    Lab 03/30/12 1149  AMMONIA 53   CBC:  Lab 04/02/12 0428 04/01/12 0400 03/31/12 0405 03/30/12 0355 03/29/12 2125  WBC 4.3 3.7* 5.1 4.7 6.3  NEUTROABS -- -- -- -- 3.8  HGB 9.6* 8.6* 9.9* 9.2* 11.1*  HCT 29.1* 25.6* 28.9* 27.4* 32.6*  MCV 94.2 93.4 91.2 92.3 92.4  PLT 69* 62* 77* 88* 97*    Principal Problem:  *GI bleed Active Problems:  Alcohol abuse  Hematemesis  Reflux esophagitis  Generalized abdominal pain  Normocytic anemia  Thrombocytopenia  Hypomagnesemia   Time coordinating discharge: 35 minutes  Signed:  Brendia Sacks, MD [facility] 04/02/2012, 3:52 PM

## 2012-04-02 NOTE — Progress Notes (Signed)
Patient ID: Lisa Stuart, female   DOB: [birth year], 51 y.o.   MRN: [id number] [facility] Progress Note  Subjective: Feeling better- hungry, no nausea or vomiting. Has not had a bm in a few days  UGI- unremarkable-   Objective:  Vital signs in last 24 hours: Temp:  [98.4 F (36.9 C)-99.5 F (37.5 C)] 98.6 F (37 C) (08/09 0536) Pulse Rate:  [85-93] 88  (08/09 0536) Resp:  [16] 16  (08/09 0536) BP: (106-120)/(69-79) 106/69 mmHg (08/09 0536) SpO2:  [99 %-100 %] 99 % (08/09 0536)   General:   Alert,  Well-developed,    in NAD Heart:  Regular rate and rhythm; no murmurs Pulm;clear Abdomen:  Soft,  Mild tendenessr and nondistended. Normal bowel sounds, without guarding Extremities:  Without edema. Neurologic:  Alert and  oriented x4;  grossly normal neurologically. Psych:  Alert and cooperative. Normal mood and affect.  Intake/Output from previous day: 08/08 0701 - 08/09 0700 In: 1490 [P.O.:1440; IV Piggyback:50] Out: 2500 [Urine:2500] Intake/Output this shift:    Lab Results:  Basename 04/02/12 0428 04/01/12 0400 03/31/12 0405  WBC 4.3 3.7* 5.1  HGB 9.6* 8.6* 9.9*  HCT 29.1* 25.6* 28.9*  PLT 69* 62* 77*   BMET  Basename 04/02/12 0428 04/01/12 0400 03/31/12 1450  NA 135 136 136  K 4.0 3.1* 3.0*  CL 100 103 100  CO2 27 24 26   GLUCOSE 110* 115* 116*  BUN <3* <3* 3*  CREATININE 0.41* 0.37* 0.38*  CALCIUM 9.3 8.5 8.6      PT/INR  Basename 03/30/12 1150  LABPROT 14.2  INR 1.08    Assessment / Plan:  #1 51 yo female with hx ETOH abuse and 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  OK for discharge from GI standpoint Principal Problem:  *GI bleed Active Problems:  Alcohol abuse  Hematemesis  Reflux esophagitis  Generalized abdominal pain  Normocytic anemia  Thrombocytopenia   Hypomagnesemia     LOS: 4 days   Lisa Stuart  04/02/2012, 10:19 AM  I have reviewed the above note and agree with plan of treatment.UGI series does not show any evidence of mechanical obstruction. OK for discharge today.  Lisa [facility] Pager # [phone]

## 2012-04-02 NOTE — Progress Notes (Signed)
Physical Therapy -- Spoke w/ RN and pt. Pt is up ad lib. NO acute needs. PT signing off. Blanchard Kelch PT [phone]

## 2012-04-02 NOTE — Progress Notes (Signed)
[facility] PROGRESS NOTE  Lisa Stuart YNW:[id number] DOB: 10-24-1960 DOA: 03/29/2012 PCP: No primary provider on file.  Assessment/Plan: 1. Hematemesis/GIB: No further bleeding, hemoglobin stable. Secondary to esophageal erosions, gastritis. Continue PPI, carafate. Appreciate GI evaluation and recommendations.  2. Partial GOO: Resolved. UGI series unremarkable. 3. Generalized abdominal pain: Resolved. Suspect secondary to alcohol-induced gastritis/esophagitis. 4. Normocytic anemia: Stable.  5. Thrombocytopenia: Stable--suspect secondary to alcohol abuse. CBC in AM.  6. Alcohol withdrawal: Appears stable. 7. Hypokalemia: Repleted. 8. Hypomagnesemia: Replete.  9. Alcohol abuse: CSW consult. 10. History of pancreatitis  Stable for discharge today per GI.  Code Status: full code Family Communication:  Disposition Plan: home   Brendia Sacks, MD  [facility] Team 4 Pager [phone]. If 8PM-8AM, please contact night-coverage at [email or web address], password [facility] 04/02/2012, 1:05 PM  LOS: 4 days   Brief narrative: 51 year old woman with history off alcoholism, alcoholic pancreatitis, gallstones presented with 4 episodes of hematemesis and dark tarry stools since yesterday in the setting off active alcohol use. She was also noted to be tremulous with concern for early alcohol withdrawal on presentation.  300+ pages of records received from [facility] [place]. Pt was last admitted 12/2011 and left AMA with discharge diagnoses acute pancreatitis, delirium tremens, pancreatic phlegmon, hepatic encephalopathy, alcohol abuse, anemia, thrombocytopenia, hypomagnesemia. Records show Abdominal Us-CBD dilitation to 1cm (was 4.60mm) and gallbladder sludge,fatty liver. MRI [name] 5/13 showsno gallstones,dilated CBD without filling defects,mild dialtion of pancreatic duct,mild pancreatitis involving tail of pancreas, and hepatomegaly. Pt went thru DT's during that admission.Plan was for f/u CT in 6-8  weeks. Several admissions in 2010-2013 all with Alcohol induced pancreatitis or hepatitis and withdrawal.  Consultants:  [facility]  Procedures:  EGD--pyloric stenosis, moderate gastritis, esophagitis in the distal esophagus. Hematemesis due to esophageal erosions.  HPI/Subjective: Feels overall better. No complaints. Tolerating food.  Objective: Filed Vitals:   04/01/12 0600 04/01/12 1500 04/01/12 2115 04/02/12 0536  BP: 123/84 120/79 114/78 106/69  Pulse: 88 85 93 88  Temp: 97.8 F (36.6 C) 98.4 F (36.9 C) 99.5 F (37.5 C) 98.6 F (37 C)  TempSrc: Oral Oral Oral Oral  Resp: 16 16 16 16   Height:      Weight:      SpO2: 99% 99% 100% 99%    Intake/Output Summary (Last 24 hours) at 04/02/12 1305 Last data filed at 04/02/12 0539  Gross per 24 hour  Intake   1440 ml  Output   1800 ml  Net   -360 ml   Wt Readings from Last 3 Encounters:  03/30/12 46.2 kg (101 lb 13.6 oz)  03/30/12 46.2 kg (101 lb 13.6 oz)    Exam:   General:  Appears calm and comfortable.  Cardiovascular: RRR, no m/r/g. No LE edema.  Respiratory: CTA bilaterally, no w/r/r. Normal respiratory effort.  Data Reviewed: Basic Metabolic Panel:  Lab 04/02/12 6295 04/01/12 0400 03/31/12 1450 03/31/12 0405 03/30/12 0355  NA 135 136 136 135 138  K 4.0 3.1* 3.0* 2.7* 3.8  CL 100 103 100 100 106  CO2 27 24 26 25  17*  GLUCOSE 110* 115* 116* 110* 120*  BUN <3* <3* 3* 3* 11  CREATININE 0.41* 0.37* 0.38* 0.42* 0.60  CALCIUM 9.3 8.5 8.6 8.9 8.6  MG 1.3* 1.4* -- 1.0* --  PHOS -- -- -- -- --   Liver Function Tests:  Lab 03/30/12 0355 03/29/12 2125  AST 72* 89*  ALT 28 34  ALKPHOS 110 135*  BILITOT 1.1 1.3*  PROT 6.8 8.2  ALBUMIN 3.6 4.5    Lab 03/31/12 0405 03/29/12 2125  LIPASE 126* 157*  AMYLASE -- --    Lab 03/30/12 1149  AMMONIA 53   CBC:  Lab 04/02/12 0428 04/01/12 0400 03/31/12 0405 03/30/12 0355 03/29/12 2125  WBC 4.3 3.7* 5.1 4.7 6.3  NEUTROABS -- -- -- -- 3.8  HGB 9.6* 8.6*  9.9* 9.2* 11.1*  HCT 29.1* 25.6* 28.9* 27.4* 32.6*  MCV 94.2 93.4 91.2 92.3 92.4  PLT 69* 62* 77* 88* 97*   Studies: Ct Head Wo Contrast  03/30/2012  *RADIOLOGY REPORT*  Clinical Data: Weakness.  Dizziness.  Nausea and vomiting. Shortness of breath.  CT HEAD WITHOUT CONTRAST  Technique:  Contiguous axial images were obtained from the base of the skull through the vertex without contrast.  Comparison: None.  Findings: Mild cerebral atrophy.  No mass effect or midline shift. No abnormal extra-axial fluid collections.  Gray-white matter junctions are distinct.  No effacement of basal cisterns.  No evidence of acute intracranial hemorrhage.  No ventricular dilatation.  Mild vascular calcifications.  Visualized paranasal sinuses and mastoid air cells are not opacified.  No depressed skull fractures.  IMPRESSION: No acute intracranial abnormalities.  Original Report Authenticated By: Marlon Pel, M.D.   Dg Abd Acute W/chest  03/29/2012  *RADIOLOGY REPORT*  Clinical Data: Abdominal pain.  Hematemesis.  ACUTE ABDOMEN SERIES (ABDOMEN 2 VIEW & CHEST 1 VIEW)  Comparison: None.  Findings: Normal heart size and pulmonary vascularity.  No focal consolidation in the lungs.  No blunting of costophrenic angles.  Scattered gas and stool in the colon.  No small or large bowel dilatation.  No free intra-abdominal air.  No abnormal air fluid levels.  No radiopaque stones.  IMPRESSION: No evidence of active pulmonary disease.  Nonobstructive bowel gas pattern.  Original Report Authenticated By: Marlon Pel, M.D.  Scheduled Meds:    . folic acid  1 mg Oral Daily  . magnesium oxide  400 mg Oral BID  . magnesium sulfate 1 - 4 g bolus IVPB  2 g Intravenous Once  . multivitamin with minerals  1 tablet Oral Daily  . pantoprazole  40 mg Oral Q1200  . potassium chloride  40 mEq Oral TID  . sucralfate  1 g Oral TID WC & HS  . thiamine  100 mg Oral Daily   Or  . thiamine  100 mg Intravenous Daily  . zolpidem  5  mg Oral Once   Continuous Infusions:    Principal Problem:  *GI bleed Active Problems:  Alcohol abuse  Hematemesis  Reflux esophagitis  Generalized abdominal pain  Normocytic anemia  Thrombocytopenia  Hypomagnesemia     Brendia Sacks, MD  [facility] Team 4 Pager [phone]. If 8PM-8AM, please contact night-coverage at [email or web address], password [facility] 04/02/2012, 1:05 PM  LOS: 4 days

## 2012-04-05 ENCOUNTER — Telehealth: Payer: Self-pay | Admitting: Internal Medicine

## 2012-04-05 NOTE — Telephone Encounter (Signed)
Spoke with patient and scheduled her on 05/11/12 at 8:45 AM. Patient given phone number for patient assistance for [facility] physician.

## 2012-04-06 NOTE — Progress Notes (Signed)
Received a call from patient stating she is unable to afford her Carafate prescription. Asking if we could provide her with some free meds or provide an alternative. Encouraged her to contact [facility] pharmacy as med would likely be cheaper there, encouraged her to discuss over the counter alternative with pharmacy. Contacted Dr. Irene Limbo who wrote script and he had not other suggestions. Patient stated she was able to get PPI filled. She stated she would call some others for assistance and thanked me for my help.

## 2012-04-07 NOTE — Progress Notes (Signed)
Discussed with Dr. Joni Fears need for Carfate. Patient to continue on PPI.  Brendia Sacks, MD [facility]

## 2012-04-22 ENCOUNTER — Encounter: Payer: Self-pay | Admitting: *Deleted

## 2012-05-11 ENCOUNTER — Ambulatory Visit: Payer: Self-pay | Admitting: Internal Medicine

## 2014-02-10 ENCOUNTER — Emergency Department (HOSPITAL_BASED_OUTPATIENT_CLINIC_OR_DEPARTMENT_OTHER): Payer: Self-pay

## 2014-02-10 ENCOUNTER — Emergency Department (HOSPITAL_BASED_OUTPATIENT_CLINIC_OR_DEPARTMENT_OTHER)
Admission: EM | Admit: 2014-02-10 | Discharge: 2014-02-10 | Disposition: A | Discharge status: Home / Self Care | Payer: Self-pay | Attending: Emergency Medicine | Admitting: Emergency Medicine

## 2014-02-10 ENCOUNTER — Encounter (HOSPITAL_BASED_OUTPATIENT_CLINIC_OR_DEPARTMENT_OTHER): Payer: Self-pay | Admitting: Emergency Medicine

## 2014-02-10 DIAGNOSIS — R7309 Other abnormal glucose: Secondary | ICD-10-CM | POA: Insufficient documentation

## 2014-02-10 DIAGNOSIS — K2990 Gastroduodenitis, unspecified, without bleeding: Secondary | ICD-10-CM

## 2014-02-10 DIAGNOSIS — F17200 Nicotine dependence, unspecified, uncomplicated: Secondary | ICD-10-CM | POA: Insufficient documentation

## 2014-02-10 DIAGNOSIS — F3289 Other specified depressive episodes: Secondary | ICD-10-CM | POA: Insufficient documentation

## 2014-02-10 DIAGNOSIS — R739 Hyperglycemia, unspecified: Secondary | ICD-10-CM

## 2014-02-10 DIAGNOSIS — K5732 Diverticulitis of large intestine without perforation or abscess without bleeding: Secondary | ICD-10-CM | POA: Insufficient documentation

## 2014-02-10 DIAGNOSIS — D696 Thrombocytopenia, unspecified: Secondary | ICD-10-CM | POA: Insufficient documentation

## 2014-02-10 DIAGNOSIS — Z79899 Other long term (current) drug therapy: Secondary | ICD-10-CM | POA: Insufficient documentation

## 2014-02-10 DIAGNOSIS — K5792 Diverticulitis of intestine, part unspecified, without perforation or abscess without bleeding: Secondary | ICD-10-CM

## 2014-02-10 DIAGNOSIS — F411 Generalized anxiety disorder: Secondary | ICD-10-CM | POA: Insufficient documentation

## 2014-02-10 DIAGNOSIS — Z880 Allergy status to penicillin: Secondary | ICD-10-CM | POA: Insufficient documentation

## 2014-02-10 DIAGNOSIS — K2970 Gastritis, unspecified, without bleeding: Secondary | ICD-10-CM | POA: Insufficient documentation

## 2014-02-10 DIAGNOSIS — F329 Major depressive disorder, single episode, unspecified: Secondary | ICD-10-CM | POA: Insufficient documentation

## 2014-02-10 HISTORY — DX: Major depressive disorder, single episode, unspecified: F32.9

## 2014-02-10 HISTORY — DX: Anxiety disorder, unspecified: F41.9

## 2014-02-10 HISTORY — DX: Headache, unspecified: R51.9

## 2014-02-10 HISTORY — DX: Headache: R51

## 2014-02-10 HISTORY — DX: Depression, unspecified: F32.A

## 2014-02-10 LAB — URINALYSIS, ROUTINE W REFLEX MICROSCOPIC
BILIRUBIN URINE: NEGATIVE
Glucose, UA: >1000 mg/dL — AB
Hgb urine dipstick: NEGATIVE
Ketones, ur: NEGATIVE mg/dL
LEUKOCYTES UA: NEGATIVE
NITRITE: NEGATIVE
PH: 7 (ref 5.0–8.0)
Protein, ur: NEGATIVE mg/dL
SPECIFIC GRAVITY, URINE: 1.034 — AB (ref 1.005–1.030)
UROBILINOGEN UA: 0.2 mg/dL (ref 0.0–1.0)

## 2014-02-10 LAB — CBC WITH DIFFERENTIAL/PLATELET
BASOS PCT: 0 % (ref 0–1)
Basophils Absolute: 0 10*3/uL (ref 0.0–0.1)
EOS ABS: 0.2 10*3/uL (ref 0.0–0.7)
EOS PCT: 3 % (ref 0–5)
HEMATOCRIT: 33.7 % — AB (ref 36.0–46.0)
HEMOGLOBIN: 11.1 g/dL — AB (ref 12.0–15.0)
LYMPHS ABS: 3 10*3/uL (ref 0.7–4.0)
Lymphocytes Relative: 46 % (ref 12–46)
MCH: 29.4 pg (ref 26.0–34.0)
MCHC: 32.9 g/dL (ref 30.0–36.0)
MCV: 89.4 fL (ref 78.0–100.0)
MONO ABS: 0.3 10*3/uL (ref 0.1–1.0)
Monocytes Relative: 5 % (ref 3–12)
NEUTROS ABS: 3.1 10*3/uL (ref 1.7–7.7)
NEUTROS PCT: 46 % (ref 43–77)
Platelets: 87 10*3/uL — ABNORMAL LOW (ref 150–400)
RBC: 3.77 MIL/uL — AB (ref 3.87–5.11)
RDW: 13.3 % (ref 11.5–15.5)
WBC: 6.6 10*3/uL (ref 4.0–10.5)

## 2014-02-10 LAB — COMPREHENSIVE METABOLIC PANEL
ALT: 20 U/L (ref 0–35)
AST: 29 U/L (ref 0–37)
Albumin: 4.3 g/dL (ref 3.5–5.2)
Alkaline Phosphatase: 255 U/L — ABNORMAL HIGH (ref 39–117)
BUN: 11 mg/dL (ref 6–23)
CALCIUM: 10.1 mg/dL (ref 8.4–10.5)
CO2: 23 meq/L (ref 19–32)
CREATININE: 0.6 mg/dL (ref 0.50–1.10)
Chloride: 94 mEq/L — ABNORMAL LOW (ref 96–112)
GLUCOSE: 617 mg/dL — AB (ref 70–99)
Potassium: 4.6 mEq/L (ref 3.7–5.3)
Sodium: 130 mEq/L — ABNORMAL LOW (ref 137–147)
TOTAL PROTEIN: 8.4 g/dL — AB (ref 6.0–8.3)
Total Bilirubin: 0.4 mg/dL (ref 0.3–1.2)

## 2014-02-10 LAB — CBG MONITORING, ED: GLUCOSE-CAPILLARY: 388 mg/dL — AB (ref 70–99)

## 2014-02-10 LAB — URINE MICROSCOPIC-ADD ON

## 2014-02-10 MED ORDER — METRONIDAZOLE 500 MG PO TABS: 500.0000 mg | ORAL_TABLET | Freq: Two times a day (BID) | ORAL | Status: DC

## 2014-02-10 MED ORDER — BLOOD GLUCOSE MONITORING SUPPL KIT: PACK | Status: AC

## 2014-02-10 MED ORDER — CIPROFLOXACIN HCL 500 MG PO TABS: 500.0000 mg | ORAL_TABLET | Freq: Two times a day (BID) | ORAL | Status: DC

## 2014-02-10 MED ORDER — IOHEXOL 300 MG/ML  SOLN
100.0000 mL | Freq: Once | INTRAMUSCULAR | Status: AC | PRN
  Administered 2014-02-10: 100 mL via INTRAVENOUS

## 2014-02-10 MED ORDER — METFORMIN HCL 1000 MG PO TABS: 1000.0000 mg | ORAL_TABLET | Freq: Two times a day (BID) | ORAL | Status: DC

## 2014-02-10 MED ORDER — IOHEXOL 300 MG/ML  SOLN
50.0000 mL | Freq: Once | INTRAMUSCULAR | Status: AC | PRN
  Administered 2014-02-10: 50 mL via ORAL

## 2014-02-10 MED ORDER — SODIUM CHLORIDE 0.9 % IV SOLN
Freq: Once | INTRAVENOUS | Status: AC
  Administered 2014-02-10: 18:00:00 via INTRAVENOUS

## 2014-02-10 NOTE — ED Provider Notes (Signed)
Medical screening examination/treatment/procedure(s) were performed by non-physician practitioner and as supervising physician I was immediately available for consultation/collaboration.    Nelia Shiobert L Beaton, MD 02/10/14 2300

## 2014-02-10 NOTE — ED Notes (Signed)
Pt. Con't to ask about leaving.  Encouraged Pt. To wait and also informed Pt. That all test results have not resulted as of yet.

## 2014-02-10 NOTE — Discharge Instructions (Signed)
Glucose, Blood Sugar, Fasting Blood Sugar This is a test to measure your blood sugar. Glucose is a simple sugar that serves as the main source of energy for the body. The carbohydrates we eat are broken down into glucose (and a few other simple sugars), absorbed by the small intestine, and circulated throughout the body. Most of the body's cells require glucose for energy production; brain and nervous system cells not only rely on glucose for energy, they can only function when glucose levels in the blood remain above a certain level.  The body's use of glucose hinges on the availability of insulin, a hormone produced by the pancreas. Insulin acts as a Control and instrumentation engineer, transporting glucose into the body's cells, directing the body to store excess glucose as glycogen (for short-term storage) and/or as triglycerides in fat cells. We can not live without glucose or insulin, and they must be in balance.  Normally, blood glucose levels rise slightly after a meal, and insulin is secreted to lower them, with the amount of insulin released matched up with the size and content of the meal. If blood glucose levels drop too low, such as might occur in between meals or after a strenuous workout, glucagon (another pancreatic hormone) is secreted to tell the liver to turn some glycogen back into glucose, raising the blood glucose levels. If the glucose/insulin feedback mechanism is working properly, the amount of glucose in the blood remains fairly stable. If the balance is disrupted and glucose levels in the blood rise, then the body tries to restore the balance, both by increasing insulin production and by excreting glucose in the urine.  PREPARATION FOR TEST A blood sample drawn from a vein in your arm or, for a self check, a drop of blood from a skin prick; in general, it may be recommended that you fast before having a blood glucose test; sometimes a random (no preparation) urine sample is used. Your caregiver will  instruct you as to what they want prior to your testing. NORMAL FINDINGS Normal values depend on many factors. Your lab will provide a range of normal values with your test results. The following information summarizes the meaning of the test results. These are based on the clinical practice recommendations of the American Diabetes Association.  FASTING BLOOD GLUCOSE  From 70 to 99 mg/dL (3.9 to 5.5 mmol/L): Normal glucose tolerance  From 100 to 125 mg/dL (5.6 to 6.9 mmol/L):Impaired fasting glucose (pre-diabetes)  126 mg/dL (7.0 mmol/L) and above on more than one testing occasion: Diabetes ORAL GLUCOSE TOLERANCE TEST (OGTT) [EXCEPT PREGNANCY] (2 HOURS AFTER A 75-GRAM GLUCOSE DRINK)  Less than 140 mg/dL (7.8 mmol/L): Normal glucose tolerance  From 140 to 200 mg/dL (7.8 to 11.1 mmol/L): Impaired glucose tolerance (pre-diabetes)  Over 200 mg/dL (11.1 mmol/L) on more than one testing occasion: Diabetes GESTATIONAL DIABETES SCREENING: GLUCOSE CHALLENGE TEST (1 HOUR AFTER A 50-GRAM GLUCOSE DRINK)  Less than 140* mg/dL (7.8 mmol/L): Normal glucose tolerance  140* mg/dL (7.8 mmol/L) and over: Abnormal, needs OGTT (see below) * Some use a cutoff of More Than 130 mg/dL (7.2 mmol/L) because that identifies 90% of women with gestational diabetes, compared to 80% identified using the threshold of More Than 140 mg/dL (7.8 mmol/L). GESTATIONAL DIABETES DIAGNOSTIC: OGTT (100-GRAM GLUCOSE DRINK)  Fasting*..........................................95 mg/dL (5.3 mmol/L)  1 hour after glucose load*..............180 mg/dL (10.0 mmol/L)  2 hours after glucose load*.............155 mg/dL (8.6 mmol/L)  3 hours after glucose load* **.........140 mg/dL (7.8 mmol/L) * If two or more values  are above the criteria, gestational diabetes is diagnosed. ** A 75-gram glucose load may be used, although this method is not as well validated as the 100-gram OGTT; the 3-hour sample is not drawn if 75 grams is used.    Ranges for normal findings may vary among different laboratories and hospitals. You should always check with your doctor after having lab work or other tests done to discuss the meaning of your test results and whether your values are considered within normal limits. MEANING OF TEST  Your caregiver will go over the test results with you and discuss the importance and meaning of your results, as well as treatment options and the need for additional tests if necessary. OBTAINING THE TEST RESULTS It is your responsibility to obtain your test results. Ask the lab or department performing the test when and how you will get your results. Document Released: 09/12/2004 Document Revised: 11/03/2011 Document Reviewed: 07/22/2008 [facility] Patient Information 2015 [place], [place]. This information is not intended to replace advice given to you by your health care provider. Make sure you discuss any questions you have with your health care provider.  Diverticulitis Diverticulitis is inflammation or infection of small pouches in your colon that form when you have a condition called diverticulosis. The pouches in your colon are called diverticula. Your colon, or large intestine, is where water is absorbed and stool is formed. Complications of diverticulitis can include:  Bleeding.  Severe infection.  Severe pain.  Perforation of your colon.  Obstruction of your colon. CAUSES  Diverticulitis is caused by bacteria. Diverticulitis happens when stool becomes trapped in diverticula. This allows bacteria to grow in the diverticula, which can lead to inflammation and infection. RISK FACTORS People with diverticulosis are at risk for diverticulitis. Eating a diet that does not include enough fiber from fruits and vegetables may make diverticulitis more likely to develop. SYMPTOMS  Symptoms of diverticulitis may include:  Abdominal pain and tenderness. The pain is normally located on the left side of the  abdomen, but may occur in other areas.  Fever and chills.  Bloating.  Cramping.  Nausea.  Vomiting.  Constipation.  Diarrhea.  Blood in your stool. DIAGNOSIS  Your health care provider will ask you about your medical history and do a physical exam. You may need to have tests done because many medical conditions can cause the same symptoms as diverticulitis. Tests may include:  Blood tests.  Urine tests.  Imaging tests of the abdomen, including X-rays and CT scans. When your condition is under control, your health care provider may recommend that you have a colonoscopy. A colonoscopy can show how severe your diverticula are and whether something else is causing your symptoms. TREATMENT  Most cases of diverticulitis are mild and can be treated at home. Treatment may include:  Taking over-the-counter pain medicines.  Following a clear liquid diet.  Taking antibiotic medicines by mouth for 7-10 days. More severe cases may be treated at a hospital. Treatment may include:  Not eating or drinking.  Taking prescription pain medicine.  Receiving antibiotic medicines through an IV tube.  Receiving fluids and nutrition through an IV tube.  Surgery. HOME CARE INSTRUCTIONS   Follow your health care provider's instructions carefully.  Follow a full liquid diet or other diet as directed by your health care provider. After your symptoms improve, your health care provider may tell you to change your diet. He or she may recommend you eat a high-fiber diet. Fruits and vegetables are good sources  of fiber. Fiber makes it easier to pass stool.  Take fiber supplements or probiotics as directed by your health care provider.  Only take medicines as directed by your health care provider.  Keep all your follow-up appointments. SEEK MEDICAL CARE IF:   Your pain does not improve.  You have a hard time eating food.  Your bowel movements do not return to normal. SEEK IMMEDIATE  MEDICAL CARE IF:   Your pain becomes worse.  Your symptoms do not get better.  Your symptoms suddenly get worse.  You have a fever.  You have repeated vomiting.  You have bloody or black, tarry stools. MAKE SURE YOU:   Understand these instructions.  Will watch your condition.  Will get help right away if you are not doing well or get worse. Document Released: 05/21/2005 Document Revised: 08/16/2013 Document Reviewed: 07/06/2013 [facility] Patient Information 2015 [place], [place]. This information is not intended to replace advice given to you by your health care provider. Make sure you discuss any questions you have with your health care provider.  Emergency Department Resource Guide 1) Find a Doctor and Pay Out of Pocket Although you won't have to find out who is covered by your insurance plan, it is a good idea to ask around and get recommendations. You will then need to call the office and see if the doctor you have chosen will accept you as a new patient and what types of options they offer for patients who are self-pay. Some doctors offer discounts or will set up payment plans for their patients who do not have insurance, but you will need to ask so you aren't surprised when you get to your appointment.  2) Contact Your Local Health Department Not all health departments have doctors that can see patients for sick visits, but many do, so it is worth a call to see if yours does. If you don't know where your local health department is, you can check in your phone book. The CDC also has a tool to help you locate your state's health department, and many state websites also have listings of all of their local health departments.  3) Find a Walk-in Clinic If your illness is not likely to be very severe or complicated, you may want to try a walk in clinic. These are popping up all over the country in pharmacies, drugstores, and shopping centers. They're usually staffed by nurse  practitioners or physician assistants that have been trained to treat common illnesses and complaints. They're usually fairly quick and inexpensive. However, if you have serious medical issues or chronic medical problems, these are probably not your best option.  No Primary Care Doctor: - Call Health Connect at  [phone] - they can help you locate a primary care doctor that  accepts your insurance, provides certain services, etc. - Physician Referral Service- [phone]  Chronic Pain Problems: Organization         Address  Phone   Notes  [facility]  [phone] Patients need to be referred by their primary care doctor.   Medication Assistance: Organization         Address  Phone   Notes  [facility] [address] [place], [place] [phone] --Must be a resident of [place] -- Must have NO insurance coverage whatsoever (no Medicaid/ Medicare, etc.) -- The pt. MUST have a primary care doctor that directs their care regularly and follows them in  the community   [facility]  [phone]   [address]  [phone]    Agencies that provide inexpensive medical care: Organization         Address  Phone   Notes  [facility]  [phone]   [facility]    [phone]   [facility] [address] [place], [place] [address] [phone]   [facility] [address] [place], [place] [phone]   Planned Parenthood    [phone]   [facility]    [phone]   [facility]  [address], [place] Phone:  [phone], Fax:  [phone] Hours of Operation:  9 am - 6 pm, M-F.  Also accepts Medicaid/Medicare and self-pay.  [facility]  [address], [place] Phone: [phone], Fax: [phone]. Hours of Operation:  8:30 am -  5:30 pm, M-F.  Also accepts Medicaid and self-pay.  [facility] [place] [address] [place] Phone: [phone]   [facility] [address] Natasha Bence [place], [place] [phone], Ext. [phone] Mondays & Thursdays: 7-9 AM.  First 15 patients are seen on a first come, first serve basis.    Medicaid-accepting [facility] Providers:  Organization         Address  Phone   Notes  [facility] [address], [place] [phone] Also accepts self-pay patients.  [facility] [address] Laurell Josephs [place], [place]  [phone]   [facility] [address], [place] [phone]   [facility] [address], [place] [phone]   Renaye Rakers [address], [place]   [phone] Only accepts [place] Access [place] patients after they have their name applied to their card.   Self-Pay (no insurance) in [facility]:  Organization         Address  Phone   Notes  Sickle Cell Patients, [facility] [address] [place], [place] [phone]   [facility] [address] [place], [place] [phone]   [facility] [place]  [address], [place] [phone]   [facility]/Dr. Osei-Bonsu  [address], [place] or [address], [place] [phone] Phone number for both [place] and [place] locations is the same.  [facility] [address], [place] [phone]   [facility] [address], [place] or [address] [phone]   [facility] [address], [place] [phone], phone; [phone], fax Sees patients 1st and 3rd Saturday of every month.  Must not qualify for public or private insurance (i.e. Medicaid, Medicare, [place] Health Choice,  Veterans' Benefits)  Household income should be no more than 200% of the poverty level The clinic cannot treat you if you are pregnant or think you are pregnant  Sexually transmitted diseases are not treated at the clinic.    Dental Care: Organization         Address  Phone  Notes  [facility] [address] [place], [place] [phone] Accepts children up to age 73 who are enrolled in [place]  or [place] Health Choice; pregnant women with a Medicaid card; and children who have applied for Medicaid or [place] Health Choice, but were declined, whose parents can pay a reduced fee at time of service.  [facility]  [address], [place] [phone] Accepts children up to age 67 who are enrolled in [place] or [place] Health Choice; pregnant women with a Medicaid card; and children who have applied for Medicaid or [place] Health Choice, but were declined, whose parents can pay a reduced fee at time of service.  [facility]  [address] [place], [place] [phone] Patients are seen by appointment only. Walk-ins are not accepted. [facility] will see patients 20 years of age and older. Monday - Tuesday (8am-5pm) Most Wednesdays (8:30-5pm) $30 per visit, cash only  [facility]  [address], [facility] [phone] Patients are seen by appointment only. Walk-ins are not accepted. [facility] will see patients 58 years of age and older. One Wednesday Evening (Monthly: Volunteer Based).  $30 per visit, cash only  [employer]  [phone] for adults; Children under age 83, call Graduate Pediatric Dentistry at [phone]. Children aged 49-14, please call [phone] to request a pediatric application.  Dental services are provided in all areas of dental care including fillings, crowns and bridges, complete and  partial dentures, implants, gum treatment, root canals, and extractions. Preventive care is also provided. Treatment is provided to both adults and children. Patients are selected via a lottery and there is often a waiting list.   [facility] [address] [place]  [phone] [email or web address]   [facility] [address] [place], [place] [phone], Ext. [phone] Second and Fourth Thursday of each month, opens at 6:30 AM; Clinic ends at 9 AM.  Patients are seen on a first-come first-served basis, and a limited number are seen during each clinic.   [facility]  [address] Ether Griffins [place], [place] [phone]   Eligibility Requirements You must have lived in [place], [place], or [place] counties for at least the last three months.   You cannot be eligible for state or federal sponsored [place], including [employer], [place], or [employer].   You generally cannot be eligible for healthcare insurance through your employer.    How to apply: Eligibility screenings are held every Tuesday and Wednesday afternoon from 1:00 pm until 4:00 pm. You do not need an appointment for the interview!  [facility] [address] [place], [place] [phone]   [facility] Health Department  [phone]   [facility] Health Department  [phone]   [facility] Health Department  [phone]    Behavioral Health Resources in the Community: Intensive Outpatient Programs Organization         Address  Phone  Notes  [facility] [address], [place], [place] [phone]   [facility] Outpatient [address], [place], [place] [phone]   [facility] [address] [place], [place]  [phone]   [facility] [address],  [place], [place] [phone] or [phone]   Substance Abuse Resources Organization          Address  Phone  Notes  [facility]  [phone]   [facility]  [phone]   [facility]  [phone]   Floydene Flock  [phone]   Residential & Outpatient Substance Abuse Program  [phone]   Psychological Services Organization         Address  Phone  Notes  [facility] Behavioral Health  [phone]   [facility]  [phone]   [facility] [address] [place] [phone] or [phone]    Mobile Crisis Teams Organization         Address  Phone  Notes  [facility], Mobile Crisis Care Unit  [phone]   [facility]  [address] [place], [place] [phone]   Doristine Locks [address] [place] [phone]    Self-Help/Support Groups Organization         Address  Phone             Notes  Mental Health Assoc. of  - variety of support groups  336- I7437963 Call for more information  Narcotics Anonymous (NA), [facility] [address], [employer] [place]  2 meetings at this location   Statistician         Address  Phone  Notes  [facility] [address],    [place]  [phone]   [facility]  [address], [place] [address], [place], [place] [phone]   [facility] [address] [place], [place] [phone] Admissions: 8am-3pm M-F  [facility] [address],    [place], [place] [phone]   [facility] [address] [place], [place], [place] [phone]   [facility] [address],  [place], [place] [phone]   [facility] - Intensive Outpatient [address] Laurell Josephs [address], [place], [place] [phone]   [facility] ([facility] [address] [place]  [place], [place] [phone] or [phone]   [facility] ([facility]) [address], [place], [place]  [phone] Accepts Medicaid  [facility] [address]  [place] [phone] Substance Abuse/Addiction Treatment   [facility] Organization         Address  Phone  Notes  [facility]  [phone]   Angie Fava, PhD [address] Ervin Knack [place], [place]   [phone] or [phone]   [facility]al   [address] [place], [place] [phone]   [facility] [address], [place], [place] [phone] Insurance/Medicaid/sponsorship through [facility] and Families [address]                                    [place], [place] [phone] Therapy/tele-psych/case  [facility] [address], [place] [phone]    Dr. Lolly Mustache  [phone]   [facility] [facility]  [employer] [facility] Dept. 1) [address] [place] 2) [address] [place] 3)  [address], [place] [phone]  [phone]   [facility] Child Abuse Hotline [phone] or [phone] (After Hours)

## 2014-02-10 NOTE — ED Notes (Signed)
Pt. In CT at present time.

## 2014-02-10 NOTE — ED Provider Notes (Signed)
CSN: [id number]     Arrival date & time 02/10/14  1632 History   First MD Initiated Contact with Patient 02/10/14 1650     Chief Complaint  Patient presents with  . Abdominal Pain     (Consider location/radiation/quality/duration/timing/severity/associated sxs/prior Treatment) Patient is a 53 y.o. female presenting with abdominal pain. The history is provided by the patient. No language interpreter was used.  Abdominal Pain Pain location:  Generalized Pain quality: aching, cramping and heavy   Pain radiates to:  Does not radiate Pain severity:  Moderate Onset quality:  Gradual Duration:  2 weeks Timing:  Constant Progression:  Worsening Chronicity:  New Relieved by:  Nothing Worsened by:  Nothing tried Ineffective treatments:  None tried Associated symptoms: no constipation, no diarrhea, no fever and no nausea   Risk factors: alcohol abuse     Past Medical History  Diagnosis Date  . Gallstones   . Pancreatitis   . ETOH abuse   . Shortness of breath     on exertion  . Pyloric stenosis   . Gastritis   . Esophagitis   . Anemia   . UGIB (upper gastrointestinal bleed)   . Thrombocytopenia   . Gallstones   . Anxiety   . Depression   . Headache    Past Surgical History  Procedure Laterality Date  . Total abdominal hysterectomy      for hx uterine fibroids  . Cesarean section      two times  . Foot surgery      surgery on joint in RT foot, pt states metal in foot  . Esophagogastroduodenoscopy  03/31/2012    Procedure: ESOPHAGOGASTRODUODENOSCOPY (EGD);  Surgeon: Lafayette Dragon, MD;  Location: [facility];  Service: Endoscopy;  Laterality: N/A;   No family history on file. History  Substance Use Topics  . Smoking status: Current Every Day Smoker -- 0.50 packs/day  . Smokeless tobacco: Not on file  . Alcohol Use: Yes     Comment: At [facility] for alcohol   OB History   Grav Para Term Preterm Abortions TAB SAB Ect Mult Living                 Review of Systems   Constitutional: Negative for fever.  Gastrointestinal: Positive for abdominal pain. Negative for nausea, diarrhea and constipation.  All other systems reviewed and are negative.     Allergies  Motrin and Penicillins  Home Medications   Prior to Admission medications   Medication Sig Start Date End Date Taking? Authorizing Dillen Belmontes  citalopram (CELEXA) 10 MG tablet Take 10 mg by mouth daily.   Yes Historical Lexie Morini, MD  folic acid (FOLVITE) 1 MG tablet Take 1 mg by mouth daily.   Yes Historical Saunders Arlington, MD  Melatonin 3 MG CAPS Take by mouth.   Yes Historical Deshanti Adcox, MD  propranolol (INDERAL) 60 MG tablet Take 120 mg by mouth 2 (two) times daily.   Yes Historical Ceylin Dreibelbis, MD  QUEtiapine (SEROQUEL) 50 MG tablet Take 50 mg by mouth at bedtime.   Yes Historical Janitza Revuelta, MD  topiramate (TOPAMAX) 50 MG tablet Take 50 mg by mouth 2 (two) times daily.   Yes Historical Asheley Hellberg, MD  Multiple Vitamin (MULTIVITAMIN WITH MINERALS) TABS Take 1 tablet by mouth daily. 04/02/12   Samuella Cota, MD  omeprazole (PRILOSEC) 40 MG capsule Take 80 mg by mouth daily. 04/02/12 04/02/13  Samuella Cota, MD  potassium chloride SA (K-DUR,KLOR-CON) 20 MEQ tablet Take 2 tablets (40 mEq total)  by mouth daily. 04/02/12 04/02/13  Samuella Cota, MD  sucralfate (CARAFATE) 1 GM/10ML suspension Take 10 mLs (1 g total) by mouth 4 (four) times daily -  with meals and at bedtime. 04/02/12 05/02/12  Samuella Cota, MD   BP 103/76  Pulse 64  Temp(Src) 98.5 F (36.9 C) (Oral)  Resp 18  Ht _0  (1.549 m)  Wt 126 lb (57.153 kg)  BMI 23.82 kg/m2  SpO2 100% Physical Exam  Nursing note and vitals reviewed. Constitutional: She is oriented to person, place, and time. She appears well-developed and well-nourished.  HENT:  Head: Normocephalic and atraumatic.  Eyes: EOM are normal. Pupils are equal, round, and reactive to light.  Neck: Normal range of motion.  Cardiovascular: Normal rate, regular rhythm and intact  distal pulses.   Pulmonary/Chest: Effort normal.  Abdominal: Soft. She exhibits distension. There is tenderness.  Musculoskeletal: Normal range of motion.  Neurological: She is alert and oriented to person, place, and time.  Skin: Skin is warm.  Psychiatric: She has a normal mood and affect.    ED Course  Procedures (including critical care time) Labs Review Labs Reviewed  CBC WITH DIFFERENTIAL - Abnormal; Notable for the following:    RBC 3.77 (*)    Hemoglobin 11.1 (*)    HCT 33.7 (*)    Platelets 87 (*)    All other components within normal limits  COMPREHENSIVE METABOLIC PANEL - Abnormal; Notable for the following:    Sodium 130 (*)    Chloride 94 (*)    Glucose, Bld 617 (*)    Total Protein 8.4 (*)    Alkaline Phosphatase 255 (*)    All other components within normal limits  URINALYSIS, ROUTINE W REFLEX MICROSCOPIC - Abnormal; Notable for the following:    APPearance CLOUDY (*)    Specific Gravity, Urine 1.034 (*)    Glucose, UA >1000 (*)    All other components within normal limits  URINE MICROSCOPIC-ADD ON    Imaging Review No results found.   EKG Interpretation None      MDM Pt has elevated glucose of 617.   Pt reports she has been eating bags full of candy to stop alcohol craving.  Pt counseled on diabetes and diverticulitis   Final diagnoses:  Diverticulitis of intestine without perforation or abscess without bleeding  Hyperglycemia    rx for flagyl cipro Metformin Diabetes test kit Resource guide.\ Diabetes education    Fransico Meadow, [place] 02/10/14 2116

## 2014-02-10 NOTE — ED Notes (Signed)
Call from lab with glucose of 617 [facility]

## 2014-02-10 NOTE — ED Notes (Signed)
[facility]  Since 28th of April.  Pt. Reports she has had problem with alcohol and is at [facility] now.  Pt. Has not  Had alcohol since April 10th.  Pt. Reports she has had history of Hep A and pancreatitis.  Pt. Has noted large abd.  With distention.

## 2014-02-10 NOTE — ED Notes (Signed)
[facility]  Clean since April 10.  Abd swelling and pain x1 week.  Vaginal itching.

## 2014-02-10 NOTE — ED Notes (Signed)
Family at bedside. 

## 2014-02-11 ENCOUNTER — Emergency Department (HOSPITAL_BASED_OUTPATIENT_CLINIC_OR_DEPARTMENT_OTHER)
Admission: EM | Admit: 2014-02-11 | Discharge: 2014-02-11 | Disposition: A | Discharge status: Home / Self Care | Payer: Self-pay | Attending: Emergency Medicine | Admitting: Emergency Medicine

## 2014-02-11 ENCOUNTER — Encounter (HOSPITAL_BASED_OUTPATIENT_CLINIC_OR_DEPARTMENT_OTHER): Payer: Self-pay | Admitting: Emergency Medicine

## 2014-02-11 DIAGNOSIS — E1165 Type 2 diabetes mellitus with hyperglycemia: Secondary | ICD-10-CM

## 2014-02-11 DIAGNOSIS — Z79899 Other long term (current) drug therapy: Secondary | ICD-10-CM | POA: Insufficient documentation

## 2014-02-11 DIAGNOSIS — Z880 Allergy status to penicillin: Secondary | ICD-10-CM | POA: Insufficient documentation

## 2014-02-11 DIAGNOSIS — F17200 Nicotine dependence, unspecified, uncomplicated: Secondary | ICD-10-CM | POA: Insufficient documentation

## 2014-02-11 DIAGNOSIS — Q400 Congenital hypertrophic pyloric stenosis: Secondary | ICD-10-CM | POA: Insufficient documentation

## 2014-02-11 DIAGNOSIS — F329 Major depressive disorder, single episode, unspecified: Secondary | ICD-10-CM | POA: Insufficient documentation

## 2014-02-11 DIAGNOSIS — F411 Generalized anxiety disorder: Secondary | ICD-10-CM | POA: Insufficient documentation

## 2014-02-11 DIAGNOSIS — D696 Thrombocytopenia, unspecified: Secondary | ICD-10-CM | POA: Insufficient documentation

## 2014-02-11 DIAGNOSIS — E119 Type 2 diabetes mellitus without complications: Secondary | ICD-10-CM | POA: Insufficient documentation

## 2014-02-11 DIAGNOSIS — Z8719 Personal history of other diseases of the digestive system: Secondary | ICD-10-CM | POA: Insufficient documentation

## 2014-02-11 DIAGNOSIS — F3289 Other specified depressive episodes: Secondary | ICD-10-CM | POA: Insufficient documentation

## 2014-02-11 LAB — CBG MONITORING, ED
GLUCOSE-CAPILLARY: 430 mg/dL — AB (ref 70–99)
GLUCOSE-CAPILLARY: 387 mg/dL — AB (ref 70–99)

## 2014-02-11 MED ORDER — METRONIDAZOLE 500 MG PO TABS
500.0000 mg | ORAL_TABLET | Freq: Once | ORAL | Status: AC
  Administered 2014-02-11: 500 mg via ORAL
  Filled 2014-02-11: qty 1

## 2014-02-11 MED ORDER — INSULIN ASPART 100 UNIT/ML ~~LOC~~ SOLN: 5.0000 [IU] | Freq: Once | SUBCUTANEOUS | Status: DC

## 2014-02-11 MED ORDER — INSULIN REGULAR HUMAN 100 UNIT/ML IJ SOLN
5.0000 [IU] | Freq: Once | INTRAMUSCULAR | Status: AC
  Administered 2014-02-11: 5 [IU] via SUBCUTANEOUS
  Filled 2014-02-11: qty 1

## 2014-02-11 MED ORDER — CIPROFLOXACIN HCL 500 MG PO TABS
500.0000 mg | ORAL_TABLET | Freq: Once | ORAL | Status: AC
  Administered 2014-02-11: 500 mg via ORAL
  Filled 2014-02-11: qty 1

## 2014-02-11 MED ORDER — INSULIN REGULAR HUMAN 100 UNIT/ML IJ SOLN
10.0000 [IU] | Freq: Once | INTRAMUSCULAR | Status: AC
  Administered 2014-02-11: 10 [IU] via SUBCUTANEOUS
  Filled 2014-02-11: qty 1

## 2014-02-11 MED ORDER — METFORMIN HCL 500 MG PO TABS
1000.0000 mg | ORAL_TABLET | Freq: Once | ORAL | Status: AC
  Administered 2014-02-11: 1000 mg via ORAL
  Filled 2014-02-11: qty 2

## 2014-02-11 NOTE — ED Notes (Signed)
[facility] [place] [facility] and [facility] requested for pt transport back to facility.

## 2014-02-11 NOTE — ED Notes (Addendum)
[facility]. Pt was given rx for diabetes supplies and meds but has not been able to get filled yet. Pt denies n/v, alert and oriented.

## 2014-02-11 NOTE — ED Notes (Signed)
CBG reads 430 in ED.

## 2014-02-11 NOTE — ED Notes (Signed)
Pt requesting food, EDP made aware. Pt given cheerios with milk and artifical sweetener.

## 2014-02-11 NOTE — ED Notes (Signed)
MD at bedside. 

## 2014-02-11 NOTE — ED Provider Notes (Addendum)
CSN: [id number]     Arrival date & time 02/11/14  1791 History   First MD Initiated Contact with Patient 02/11/14 0408     Chief Complaint  Patient presents with  . Hyperglycemia     (Consider location/radiation/quality/duration/timing/severity/associated sxs/prior Treatment) HPI This is a 53 year old female who is in [facility] for alcohol abuse. She was seen here yesterday were with abdominal pain and diagnosed with diverticulitis as well as new-onset diabetes. She was placed on medications for both but she has not gotten her metformin filled. She was sleeping this morning at 3 AM when the staff checked her sugar and found it to be over 500 and she was sent here. Her only complaint at this time is frequent thirst, which she states she has had for several months now. She is still having some abdominal pain associated with her diverticulitis. She denies frequent urination.  Past Medical History  Diagnosis Date  . Gallstones   . Pancreatitis   . ETOH abuse   . Shortness of breath     on exertion  . Pyloric stenosis   . Gastritis   . Esophagitis   . Anemia   . UGIB (upper gastrointestinal bleed)   . Thrombocytopenia   . Gallstones   . Anxiety   . Depression   . Headache    Past Surgical History  Procedure Laterality Date  . Total abdominal hysterectomy      for hx uterine fibroids  . Cesarean section      two times  . Foot surgery      surgery on joint in RT foot, pt states metal in foot  . Esophagogastroduodenoscopy  03/31/2012    Procedure: ESOPHAGOGASTRODUODENOSCOPY (EGD);  Surgeon: Lafayette Dragon, MD;  Location: [facility];  Service: Endoscopy;  Laterality: N/A;   History reviewed. No pertinent family history. History  Substance Use Topics  . Smoking status: Current Every Day Smoker -- 0.50 packs/day  . Smokeless tobacco: Not on file  . Alcohol Use: Yes     Comment: At [facility] for alcohol   OB History   Grav Para Term Preterm Abortions TAB SAB Ect Mult Living           Review of Systems  All other systems reviewed and are negative.  Allergies  Motrin and Penicillins  Home Medications   Prior to Admission medications   Medication Sig Start Date End Date Taking? Authorizing Provider  citalopram (CELEXA) 10 MG tablet Take 10 mg by mouth daily.   Yes Historical Provider, MD  propranolol (INDERAL) 60 MG tablet Take 120 mg by mouth 2 (two) times daily.   Yes Historical Provider, MD  QUEtiapine (SEROQUEL) 50 MG tablet Take 50 mg by mouth at bedtime.   Yes Historical Provider, MD  topiramate (TOPAMAX) 50 MG tablet Take 50 mg by mouth 2 (two) times daily.   Yes Historical Provider, MD  Blood Glucose Monitoring Suppl KIT Please provide monitor,  Strips, needles and fingerstick device.   Dispense trade size strips, needles,  (Pharmacist please provide with affordable option) 02/10/14   Fransico Meadow, PA-C  ciprofloxacin (CIPRO) 500 MG tablet Take 1 tablet (500 mg total) by mouth 2 (two) times daily. 02/10/14   Fransico Meadow, PA-C  folic acid (FOLVITE) 1 MG tablet Take 1 mg by mouth daily.    Historical Provider, MD  Melatonin 3 MG CAPS Take by mouth.    Historical Provider, MD  metFORMIN (GLUCOPHAGE) 1000 MG tablet Take 1 tablet (1,000 mg total)  by mouth 2 (two) times daily. 02/10/14   Fransico Meadow, PA-C  metroNIDAZOLE (FLAGYL) 500 MG tablet Take 1 tablet (500 mg total) by mouth 2 (two) times daily. 02/10/14   Fransico Meadow, PA-C  Multiple Vitamin (MULTIVITAMIN WITH MINERALS) TABS Take 1 tablet by mouth daily. 04/02/12   Samuella Cota, MD  omeprazole (PRILOSEC) 40 MG capsule Take 80 mg by mouth daily. 04/02/12 04/02/13  Samuella Cota, MD  potassium chloride SA (K-DUR,KLOR-CON) 20 MEQ tablet Take 2 tablets (40 mEq total) by mouth daily. 04/02/12 04/02/13  Samuella Cota, MD  sucralfate (CARAFATE) 1 GM/10ML suspension Take 10 mLs (1 g total) by mouth 4 (four) times daily -  with meals and at bedtime. 04/02/12 05/02/12  Samuella Cota, MD   BP 96/68   Pulse 76  Temp(Src) 98.2 F (36.8 C) (Oral)  Resp 18  SpO2 99%  Physical Exam General: Well-developed, well-nourished female in no acute distress; appearance consistent with age of record HENT: normocephalic; atraumatic Eyes: pupils equal, round and reactive to light; extraocular muscles intact Neck: supple Heart: regular rate and rhythm Lungs: clear to auscultation bilaterally Abdomen: soft; nondistended; lower abdominal tenderness; no masses or hepatosplenomegaly; bowel sounds present Extremities: No deformity; full range of motion Neurologic: Awake, alert and oriented; motor function intact in all extremities and symmetric; no facial droop Skin: Warm and dry Psychiatric: Normal mood and affect    ED Course  Procedures (including critical care time)  MDM   Nursing notes and vitals signs, including pulse oximetry, reviewed.  Summary of this visit's results, reviewed by myself:  Labs:  Results for orders placed during the hospital encounter of 02/11/14 (from the past 24 hour(s))  CBG MONITORING, ED     Status: Abnormal   Collection Time    02/11/14  4:05 AM      Result Value Ref Range   Glucose-Capillary 430 (*) 70 - 99 mg/dL  CBG MONITORING, ED     Status: Abnormal   Collection Time    02/11/14  5:50 AM      Result Value Ref Range   Glucose-Capillary 387 (*) 70 - 99 mg/dL   6:07 AM Sugars improved after 10 units of insulin subcutaneous. She was also given Glucophage 1000 mg with breakfast. An additional 5 units of insulin has been ordered. The patient was advised to avoid sugar, especially candy, and we will provide instructions for a diabetic diet. She understands the need for establishing with a PCP, and she has a plan to do so.    Wynetta Fines, MD 02/11/14 [id number]  Wynetta Fines, MD 02/11/14 0254

## 2014-02-11 NOTE — ED Notes (Signed)
cbg taken was 387 mg./dcltr.

## 2014-02-11 NOTE — ED Notes (Signed)
[facility]

## 2014-02-13 ENCOUNTER — Emergency Department (HOSPITAL_BASED_OUTPATIENT_CLINIC_OR_DEPARTMENT_OTHER)
Admission: EM | Admit: 2014-02-13 | Discharge: 2014-02-13 | Disposition: A | Discharge status: Home / Self Care | Payer: Self-pay | Attending: Emergency Medicine | Admitting: Emergency Medicine

## 2014-02-13 ENCOUNTER — Encounter (HOSPITAL_BASED_OUTPATIENT_CLINIC_OR_DEPARTMENT_OTHER): Payer: Self-pay | Admitting: Emergency Medicine

## 2014-02-13 DIAGNOSIS — Z8719 Personal history of other diseases of the digestive system: Secondary | ICD-10-CM | POA: Insufficient documentation

## 2014-02-13 DIAGNOSIS — Z880 Allergy status to penicillin: Secondary | ICD-10-CM | POA: Insufficient documentation

## 2014-02-13 DIAGNOSIS — R112 Nausea with vomiting, unspecified: Secondary | ICD-10-CM | POA: Insufficient documentation

## 2014-02-13 DIAGNOSIS — F329 Major depressive disorder, single episode, unspecified: Secondary | ICD-10-CM | POA: Insufficient documentation

## 2014-02-13 DIAGNOSIS — Z862 Personal history of diseases of the blood and blood-forming organs and certain disorders involving the immune mechanism: Secondary | ICD-10-CM | POA: Insufficient documentation

## 2014-02-13 DIAGNOSIS — Z79899 Other long term (current) drug therapy: Secondary | ICD-10-CM | POA: Insufficient documentation

## 2014-02-13 DIAGNOSIS — E119 Type 2 diabetes mellitus without complications: Secondary | ICD-10-CM | POA: Insufficient documentation

## 2014-02-13 DIAGNOSIS — F3289 Other specified depressive episodes: Secondary | ICD-10-CM | POA: Insufficient documentation

## 2014-02-13 DIAGNOSIS — Z792 Long term (current) use of antibiotics: Secondary | ICD-10-CM | POA: Insufficient documentation

## 2014-02-13 DIAGNOSIS — F17200 Nicotine dependence, unspecified, uncomplicated: Secondary | ICD-10-CM | POA: Insufficient documentation

## 2014-02-13 DIAGNOSIS — F411 Generalized anxiety disorder: Secondary | ICD-10-CM | POA: Insufficient documentation

## 2014-02-13 LAB — URINALYSIS, ROUTINE W REFLEX MICROSCOPIC
GLUCOSE, UA: NEGATIVE mg/dL
Hgb urine dipstick: NEGATIVE
Ketones, ur: 15 mg/dL — AB
NITRITE: POSITIVE — AB
PH: 6 (ref 5.0–8.0)
Protein, ur: 30 mg/dL — AB
SPECIFIC GRAVITY, URINE: 1.031 — AB (ref 1.005–1.030)
Urobilinogen, UA: 1 mg/dL (ref 0.0–1.0)

## 2014-02-13 LAB — CBC WITH DIFFERENTIAL/PLATELET
BASOS ABS: 0 10*3/uL (ref 0.0–0.1)
Basophils Relative: 0 % (ref 0–1)
EOS ABS: 0.2 10*3/uL (ref 0.0–0.7)
Eosinophils Relative: 3 % (ref 0–5)
HCT: 37.4 % (ref 36.0–46.0)
Hemoglobin: 12.3 g/dL (ref 12.0–15.0)
Lymphocytes Relative: 34 % (ref 12–46)
Lymphs Abs: 2.5 10*3/uL (ref 0.7–4.0)
MCH: 29.1 pg (ref 26.0–34.0)
MCHC: 32.9 g/dL (ref 30.0–36.0)
MCV: 88.6 fL (ref 78.0–100.0)
Monocytes Absolute: 0.4 10*3/uL (ref 0.1–1.0)
Monocytes Relative: 6 % (ref 3–12)
Neutro Abs: 4.4 10*3/uL (ref 1.7–7.7)
Neutrophils Relative %: 58 % (ref 43–77)
PLATELETS: 104 10*3/uL — AB (ref 150–400)
RBC: 4.22 MIL/uL (ref 3.87–5.11)
RDW: 13.5 % (ref 11.5–15.5)
WBC: 7.5 10*3/uL (ref 4.0–10.5)

## 2014-02-13 LAB — COMPREHENSIVE METABOLIC PANEL
ALBUMIN: 4.6 g/dL (ref 3.5–5.2)
ALT: 22 U/L (ref 0–35)
AST: 47 U/L — AB (ref 0–37)
Alkaline Phosphatase: 148 U/L — ABNORMAL HIGH (ref 39–117)
BUN: 12 mg/dL (ref 6–23)
CO2: 21 mEq/L (ref 19–32)
Calcium: 10 mg/dL (ref 8.4–10.5)
Chloride: 101 mEq/L (ref 96–112)
Creatinine, Ser: 0.7 mg/dL (ref 0.50–1.10)
GFR calc non Af Amer: >90 mL/min (ref >90)
GLUCOSE: 125 mg/dL — AB (ref 70–99)
Potassium: 3.6 mEq/L — ABNORMAL LOW (ref 3.7–5.3)
SODIUM: 140 meq/L (ref 137–147)
TOTAL PROTEIN: 8.8 g/dL — AB (ref 6.0–8.3)
Total Bilirubin: 0.7 mg/dL (ref 0.3–1.2)

## 2014-02-13 LAB — URINE MICROSCOPIC-ADD ON

## 2014-02-13 LAB — LIPASE, BLOOD: Lipase: 55 U/L (ref 11–59)

## 2014-02-13 MED ORDER — SUCRALFATE 1 GM/10ML PO SUSP: 1.0000 g | Freq: Three times a day (TID) | ORAL | Status: DC

## 2014-02-13 MED ORDER — ONDANSETRON 4 MG PO TBDP: 4.0000 mg | ORAL_TABLET | Freq: Three times a day (TID) | ORAL | Status: DC | PRN

## 2014-02-13 NOTE — ED Notes (Signed)
Terri M. Charge RN to attempt blood draw on Pt.

## 2014-02-13 NOTE — ED Provider Notes (Signed)
CSN: [id number]     Arrival date & time 02/13/14  1245 History   First MD Initiated Contact with Patient 02/13/14 1404     Chief Complaint  Patient presents with  . Emesis     (Consider location/radiation/quality/duration/timing/severity/associated sxs/prior Treatment) Patient is a 53 y.o. female presenting with vomiting.  Emesis Associated symptoms: abdominal pain and headaches     Patient is a 53 year old female who is currently at [facility] being treated for alcoholism. She has been seen here at the emergency room multiple times over the last few days at the request of the staff of [facility]. On June 19 she was diagnosed with new onset diabetes, and diverticulitis. She was seen again on June 20 for hyperglycemia. Patient states she presented today because she vomited up her lunch. She eats a relatively full breakfast, and checked her blood sugar this morning it was in the 200s. She then take some Phenergan prior to lunch because she started to feel nauseated. After eating lunch she vomited it up. She has not vomited in the previous 2 days. She had had some abdominal pain previously when diagnosed with diverticulitis, and been on antibiotic therapy for this. She states that her abdominal pain is gradually improving. Additionally she notes some tingling in her fingers which has come and gone for the last few days. She also some tingling over the left side of her face, had an episode of this that lasted about 20-30 minutes today and then spontaneously resolved. She's also had some left-sided headache. She states she has been very nervous about what she is able to eat now that she has diabetes. She denies any chest pain or shortness of breath, also denies dysuria. She had had some vulvar itching and states she was treated for this. Denies any blurry vision.  Since arriving here she has eaten a bag of chips and some coffee, and has not had any further nausea.   Past Medical History  Diagnosis Date  .  Gallstones   . Pancreatitis   . ETOH abuse   . Shortness of breath     on exertion  . Pyloric stenosis   . Gastritis   . Esophagitis   . Anemia   . UGIB (upper gastrointestinal bleed)   . Thrombocytopenia   . Gallstones   . Anxiety   . Depression   . Headache    Past Surgical History  Procedure Laterality Date  . Total abdominal hysterectomy      for hx uterine fibroids  . Cesarean section      two times  . Foot surgery      surgery on joint in RT foot, pt states metal in foot  . Esophagogastroduodenoscopy  03/31/2012    Procedure: ESOPHAGOGASTRODUODENOSCOPY (EGD);  Surgeon: Lafayette Dragon, MD;  Location: [facility];  Service: Endoscopy;  Laterality: N/A;   No family history on file. History  Substance Use Topics  . Smoking status: Current Every Day Smoker -- 0.50 packs/day  . Smokeless tobacco: Not on file  . Alcohol Use: Yes     Comment: At [facility] for alcohol   OB History   Grav Para Term Preterm Abortions TAB SAB Ect Mult Living                 Review of Systems  Gastrointestinal: Positive for nausea, vomiting and abdominal pain.  Genitourinary: Negative for dysuria.  Neurological: Positive for headaches.      Allergies  Motrin and Penicillins  Home Medications  Prior to Admission medications   Medication Sig Start Date End Date Taking? Authorizing Provider  Blood Glucose Monitoring Suppl KIT Please provide monitor,  Strips, needles and fingerstick device.   Dispense trade size strips, needles,  (Pharmacist please provide with affordable option) 02/10/14   Fransico Meadow, PA-C  ciprofloxacin (CIPRO) 500 MG tablet Take 1 tablet (500 mg total) by mouth 2 (two) times daily. 02/10/14   Fransico Meadow, PA-C  citalopram (CELEXA) 10 MG tablet Take 10 mg by mouth daily.    Historical Provider, MD  folic acid (FOLVITE) 1 MG tablet Take 1 mg by mouth daily.    Historical Provider, MD  Melatonin 3 MG CAPS Take by mouth.    Historical Provider, MD  metFORMIN  (GLUCOPHAGE) 1000 MG tablet Take 1 tablet (1,000 mg total) by mouth 2 (two) times daily. 02/10/14   Fransico Meadow, PA-C  metroNIDAZOLE (FLAGYL) 500 MG tablet Take 1 tablet (500 mg total) by mouth 2 (two) times daily. 02/10/14   Fransico Meadow, PA-C  Multiple Vitamin (MULTIVITAMIN WITH MINERALS) TABS Take 1 tablet by mouth daily. 04/02/12   Samuella Cota, MD  omeprazole (PRILOSEC) 40 MG capsule Take 80 mg by mouth daily. 04/02/12 04/02/13  Samuella Cota, MD  potassium chloride SA (K-DUR,KLOR-CON) 20 MEQ tablet Take 2 tablets (40 mEq total) by mouth daily. 04/02/12 04/02/13  Samuella Cota, MD  propranolol (INDERAL) 60 MG tablet Take 120 mg by mouth 2 (two) times daily.    Historical Provider, MD  QUEtiapine (SEROQUEL) 50 MG tablet Take 50 mg by mouth at bedtime.    Historical Provider, MD  sucralfate (CARAFATE) 1 GM/10ML suspension Take 10 mLs (1 g total) by mouth 4 (four) times daily -  with meals and at bedtime. 04/02/12 05/02/12  Samuella Cota, MD  topiramate (TOPAMAX) 50 MG tablet Take 50 mg by mouth 2 (two) times daily.    Historical Provider, MD   BP 108/76  Pulse 80  Temp(Src) 98 F (36.7 C) (Oral)  Resp 16  Ht $R'5\' 1"'zi$  (1.549 m)  Wt 125 lb (56.7 kg)  BMI 23.63 kg/m2  SpO2 100% Physical Exam  Constitutional: She is oriented to person, place, and time. She appears well-developed and well-nourished. No distress.  HENT:  Head: Normocephalic and atraumatic.  Mouth/Throat: Oropharynx is clear and moist.  Eyes: Pupils are equal, round, and reactive to light.  Cardiovascular: Normal rate, regular rhythm and normal heart sounds.   No murmur heard. Pulmonary/Chest: Effort normal and breath sounds normal. No respiratory distress. She has no wheezes. She has no rales.  Abdominal: Soft. Bowel sounds are normal. She exhibits no distension and no mass. There is no tenderness. There is no rebound and no guarding.  Musculoskeletal: Normal range of motion. She exhibits no edema.  Neurological: She is  alert and oriented to person, place, and time.  Skin: Skin is warm and dry. She is not diaphoretic.  Psychiatric: She has a normal mood and affect. Her behavior is normal.    ED Course  Procedures (including critical care time) Labs Review Labs Reviewed  URINALYSIS, ROUTINE W REFLEX MICROSCOPIC - Abnormal; Notable for the following:    Color, Urine AMBER (*)    APPearance CLOUDY (*)    Specific Gravity, Urine 1.031 (*)    Bilirubin Urine SMALL (*)    Ketones, ur 15 (*)    Protein, ur 30 (*)    Nitrite POSITIVE (*)    Leukocytes, UA MODERATE (*)  All other components within normal limits  URINE MICROSCOPIC-ADD ON - Abnormal; Notable for the following:    Squamous Epithelial / LPF MANY (*)    Bacteria, UA FEW (*)    All other components within normal limits  COMPREHENSIVE METABOLIC PANEL - Abnormal; Notable for the following:    Potassium 3.6 (*)    Glucose, Bld 125 (*)    Total Protein 8.8 (*)    AST 47 (*)    Alkaline Phosphatase 148 (*)    All other components within normal limits  URINE CULTURE  CBC WITH DIFFERENTIAL  LIPASE, BLOOD    Imaging Review No results found.   EKG Interpretation None      MDM   Final diagnoses:  Non-intractable vomiting with nausea, vomiting of unspecified type    53 year old female with recently diagnosed diverticulitis and diabetes, presenting with one episode of vomiting earlier today. She is clinically well appearing with a benign abdominal exam. Her urine is a dirty catch, and thus is unreliable. Will send urine culture. She denies any significant dysuria. We will repeat her blood work, to include metabolic panel, CBC, lipase. If she tolerates intake by mouth she should be stable for discharge.  Update: pt has not had further vomiting. Her labs appear improved from three days ago. Glucose is in 120's. Stable for discharge at this time.  Chrisandra Netters, MD Family Medicine PGY-2   Leeanne Rio, MD 02/13/14 [phone]

## 2014-02-13 NOTE — ED Notes (Signed)
Pt. Was given teaching information on Diabetes by RN after verified by Dr. Radford PaxBeaton.  Pt. Will read and was thankful for the information.

## 2014-02-13 NOTE — ED Notes (Signed)
Patient eating potato chips from home.

## 2014-02-13 NOTE — ED Notes (Signed)
PT. BACK TODAY AND WAS SEEN ON Friday HERE FOR NAUSEA.  PT. BACK TODAY  BECAUSE SHE THREW UP AT NOON TODAY AND IS FROM [facility].  Pt. Was noted with high blood sugar on Friday.  Pt. In no distress.  Pt. Reports she is hungry at this time.

## 2014-02-13 NOTE — ED Notes (Signed)
Pt. Reports she is not hurting and is hungry.

## 2014-02-13 NOTE — ED Notes (Signed)
Attempted Blood draw x 2 with out success.

## 2014-02-13 NOTE — ED Notes (Signed)
States she has been vomiting. Several visits in the past week for same[facility]

## 2014-02-13 NOTE — Discharge Instructions (Signed)
Nausea and Vomiting °Nausea is a sick feeling that often comes before throwing up (vomiting). Vomiting is a reflex where stomach contents come out of your mouth. Vomiting can cause severe loss of body fluids (dehydration). Children and elderly adults can become dehydrated quickly, especially if they also have diarrhea. Nausea and vomiting are symptoms of a condition or disease. It is important to find the cause of your symptoms. °CAUSES  °· Direct irritation of the stomach lining. This irritation can result from increased acid production (gastroesophageal reflux disease), infection, food poisoning, taking certain medicines (such as nonsteroidal anti-inflammatory drugs), alcohol use, or tobacco use. °· Signals from the brain. These signals could be caused by a headache, heat exposure, an inner ear disturbance, increased pressure in the brain from injury, infection, a tumor, or a concussion, pain, emotional stimulus, or metabolic problems. °· An obstruction in the gastrointestinal tract (bowel obstruction). °· Illnesses such as diabetes, hepatitis, gallbladder problems, appendicitis, kidney problems, cancer, sepsis, atypical symptoms of a heart attack, or eating disorders. °· Medical treatments such as chemotherapy and radiation. °· Receiving medicine that makes you sleep (general anesthetic) during surgery. °DIAGNOSIS °Your caregiver may ask for tests to be done if the problems do not improve after a few days. Tests may also be done if symptoms are severe or if the reason for the nausea and vomiting is not clear. Tests may include: °· Urine tests. °· Blood tests. °· Stool tests. °· Cultures (to look for evidence of infection). °· X-rays or other imaging studies. °Test results can help your caregiver make decisions about treatment or the need for additional tests. °TREATMENT °You need to stay well hydrated. Drink frequently but in small amounts. You may wish to drink water, sports drinks, clear broth, or eat frozen  ice pops or gelatin dessert to help stay hydrated. When you eat, eating slowly may help prevent nausea. There are also some antinausea medicines that may help prevent nausea. °HOME CARE INSTRUCTIONS  °· Take all medicine as directed by your caregiver. °· If you do not have an appetite, do not force yourself to eat. However, you must continue to drink fluids. °· If you have an appetite, eat a normal diet unless your caregiver tells you differently. °¨ Eat a variety of complex carbohydrates (rice, wheat, potatoes, bread), lean meats, yogurt, fruits, and vegetables. °¨ Avoid high-fat foods because they are more difficult to digest. °· Drink enough water and fluids to keep your urine clear or pale yellow. °· If you are dehydrated, ask your caregiver for specific rehydration instructions. Signs of dehydration may include: °¨ Severe thirst. °¨ Dry lips and mouth. °¨ Dizziness. °¨ Dark urine. °¨ Decreasing urine frequency and amount. °¨ Confusion. °¨ Rapid breathing or pulse. °SEEK IMMEDIATE MEDICAL CARE IF:  °· You have blood or brown flecks (like coffee grounds) in your vomit. °· You have black or bloody stools. °· You have a severe headache or stiff neck. °· You are confused. °· You have severe abdominal pain. °· You have chest pain or trouble breathing. °· You do not urinate at least once every 8 hours. °· You develop cold or clammy skin. °· You continue to vomit for longer than 24 to 48 hours. °· You have a fever. °MAKE SURE YOU:  °· Understand these instructions. °· Will watch your condition. °· Will get help right away if you are not doing well or get worse. °Document Released: 08/11/2005 Document Revised: 11/03/2011 Document Reviewed: 01/08/2011 °ExitCare® Patient Information ©2015 [employer]. This information is not intended   to replace advice given to you by your health care provider. Make sure you discuss any questions you have with your health care provider. ° ° °Emergency Department Resource Guide °1) Find a  Doctor and Pay Out of Pocket °Although you won't have to find out who is covered by your insurance plan, it is a good idea to ask around and get recommendations. You will then need to call the office and see if the doctor you have chosen will accept you as a new patient and what types of options they offer for patients who are self-pay. Some doctors offer discounts or will set up payment plans for their patients who do not have insurance, but you will need to ask so you aren't surprised when you get to your appointment. ° °2) Contact Your Local Health Department °Not all health departments have doctors that can see patients for sick visits, but many do, so it is worth a call to see if yours does. If you don't know where your local health department is, you can check in your phone book. The CDC also has a tool to help you locate your state's health department, and many state websites also have listings of all of their local health departments. ° °3) Find a Walk-in Clinic °If your illness is not likely to be very severe or complicated, you may want to try a walk in clinic. These are popping up all over the country in pharmacies, drugstores, and shopping centers. They're usually staffed by nurse practitioners or physician assistants that have been trained to treat common illnesses and complaints. They're usually fairly quick and inexpensive. However, if you have serious medical issues or chronic medical problems, these are probably not your best option. ° °No Primary Care Doctor: °- Call [facility] at  832-8000 - they can help you locate a primary care doctor that  accepts your insurance, provides certain services, etc. °- Physician Referral Service- 1-800-533-3463 ° °Chronic Pain Problems: °Organization         Address  Phone   Notes  °[facility]  (336) 297-2271 Patients need to be referred by their primary care doctor.  ° °Medication Assistance: °Organization         Address  Phone    Notes  °[place] [facility] [address] °[place], [place] [address] (336) 641-8030 --Must be a resident of [place] °-- Must have NO insurance coverage whatsoever (no Medicaid/ Medicare, etc.) °-- The pt. MUST have a primary care doctor that directs their care regularly and follows them in the community °  °[facility]  (866) 331-1348   °[employer]  (888) 892-1162   ° °Agencies that provide inexpensive medical care: °Organization         Address  Phone   Notes  °[facility]  (336) 832-8035   °[facility]    (336) 832-7272   °[facility] [address] °[place], [place] [address] (336) 832-4777   °[facility] [address], °[place] (336) 271-4999   °[facility]    (336) 373-0678   °[facility]    (336) 272-1050   °[facility] ° [address], [place] Phone:  (336) 832-4444, Fax:  (336) 832-4440 Hours of Operation:  9 am - 6 pm, M-F.  Also accepts Medicaid/Medicare and self-pay.  °[facility] ° [address], [place]   Phone: (336) 832-3150, Fax: (336) 832-3151. Hours of Operation:  8:30 am - 5:30 pm, M-F.  Also accepts Medicaid and self-pay.  °[facility] [place] [address], [place] Phone: (336) 878-6027   °[facility] [address], [place], [place] (336)723-1848, Ext. 123 Mondays & Thursdays: 7-9 AM.  First 15 patients are seen on a first come, first serve basis. °  ° °Medicaid-accepting [place] Providers: ° °Organization         Address  Phone   Notes  °[facility] [address], [place] (336) 641-2100 Also accepts self-pay patients.  °[facility] [address], [place] ° (336) 856-9996   °[facility] [address], [place] (336) 288-8857   °[facility] [address] [place] [address], [place] (336) 299-7000   °Veita Bland [address], [place]  ° (336) 373-1557 Only accepts [place] Access Medicaid patients after they have their name applied to their card.  ° °Self-Pay (no insurance) in [place]: ° °Organization         Address  Phone   Notes  °Sickle Cell Patients, [facility] [address], [place] (336) 832-1970   °[facility] [address], [place] (336) 832-4400   °[facility] ° [address], [place] (336) 992-4800   °[facility]/Dr. Osei-Bonsu ° [address] [place] [address], [place] or [address], [place] (336) 841-8500 Phone number for both [place] and [place] locations is the same.  °[facility] [address], [place] (336) 299-0000   °[facility] [address] [place] [address], [place] or [address] (336) 852-7530 °(336) 878-2260   °[facility] [address], [place] (336) 350-1642, phone; (336) 294-5005, fax Sees patients 1st and 3rd Saturday of every month.  Must not qualify for public or private insurance (i.e. Medicaid, Medicare, [place] Health Choice, Veterans' Benefits) • Household income should be no more than 200% of the poverty level •The clinic cannot treat you if you are pregnant or think you are pregnant • Sexually transmitted diseases are not treated at the clinic.  ° ° °Dental Care: °Organization         Address  Phone  Notes  °[facility] [address], [place] (336) 641-6152 Accepts children up to age 21 who are enrolled in Medicaid or [place] Health Choice; pregnant women with a Medicaid card; and children who have applied for Medicaid or [place] Health Choice, but were declined, whose parents can pay a reduced fee at time of service.  °[facility] [place]  [address], [place] (336) 641-7733 Accepts children up to age 21 who are enrolled in Medicaid or  Health Choice; pregnant women with a Medicaid card; and children who have applied for Medicaid or  Health Choice, but were declined, whose parents can pay a reduced fee at time of service.  °[facility] ° [address],  (336) 641-4533 Patients are seen by appointment only. Walk-ins are not accepted. [facility] will see patients 18 years of age and older. °Monday - Tuesday (8am-5pm) °Most Wednesdays (8:30-5pm) °$30 per visit, cash only  °[facility] ° [address], [place] (336) 641-4533 Patients are seen by appointment only. Walk-ins are not accepted. [facility] will see patients 18 years of age and older. °One Wednesday Evening (Monthly: Volunteer Based).  $30 per visit, cash only  °[facility]  (919) 537-3737 for adults; Children under age 4, call Graduate Pediatric Dentistry at ([phone]) [phone]. Children aged 4-14, please call (919) [phone] to request a pediatric application. ° Dental services are provided in all areas of dental care including fillings, crowns and bridges, complete and partial dentures, implants, gum treatment, root canals, and extractions. Preventive care is also provided. Treatment is provided to both adults and children. °Patients are selected via a lottery and there is often a waiting list. °  °[facility] [address], °[place] ° (336) 763-8833 [email or web address] °  °[facility] [address], [place], [place] (336)723-1848, Ext. 123 Second and Fourth Thursday of each month, opens at 6:30 AM; Clinic ends at 9 AM.  Patients are seen on a first-come first-served basis, and a limited number are seen during each clinic.  ° °[facility] ° [address], [place], [place] (336) 723-7904   Eligibility Requirements °You must have lived in  [place], [place], or [place] counties for at least the last three months. °  You cannot be eligible for state or federal sponsored healthcare insurance, including Veterans Administration, Medicaid, or Medicare. °  You generally cannot be eligible for healthcare insurance through your employer.  °  How to apply: °Eligibility screenings are held every Tuesday and Wednesday afternoon from 1:00 pm until 4:00 pm. You do not need an appointment for the interview!  °[facility] [address], [place], [place] 336-631-2330   °[facility]  336-342-8273   °[place]  336-703-3100   °[facility]  336-570-6415   ° °Behavioral Health Resources in the Community: °Intensive Outpatient Programs °Organization         Address  Phone  Notes  °[facility] [address], [place], [place] 336-878-6098   °[facility] [address], [place], [place] 336-832-9800   °[facility] [address], [place], [place] ° 336-882-2125   °[facility] [address],  °[place], [place] 1-800-853-5163 or 336-641-4981   °Substance Abuse Resources °Organization         Address  Phone  Notes  °[facility]  336-882-2125   °[facility]  336-784-9470   °[facility]  336-285-9073   °[facility]  336-845-3988   °Residential & Outpatient Substance Abuse Program  1-800-659-3381   °Psychological Services °Organization         Address  Phone  Notes  °[facility]  336- 832-9600   °[facility]  336- 378-7881   °[facility] [address], [place] 1-800-853-5163 or 336-641-4981   ° °Mobile Crisis Teams °Organization         Address  Phone  Notes  °[facility], Mobile Crisis Care Unit  1-877-626-1772   °Assertive °[facility] ° [address] [place],  336-834-9664   °Sharon DeEsch [address] °[place]  336-554-5454   ° °Self-Help/Support Groups °Organization         Address  Phone             Notes  °[facility] - variety of   support groups  336- 373-1402 Call for more information  °Narcotics Anonymous (NA), [facility] [address], °[place]  2 meetings at this location  ° °Residential Treatment Programs °Organization         Address  Phone  Notes  °[facility] [address],    °[place]  1-866-801-8205   °[facility] ° [address], [place], [place] 704-293-8524   °[facility] [address], [place] 336-845-3988 Admissions: 8am-3pm M-F  °[facility] [address]    °[place], [place] 336-841-1104   °[facility] [address], [place], [place] 336-379-7146   °[facility] [address]  °[place], [place] 336-285-9073   °[facility] Intensive Outpatient [address], [place], [place] 336-852-3033   °[facility] [address]  °[place], [place] 1-877-615-2722 or 336-784-9470   °[facility] [address] [place], [place] 336-227-7417 Accepts Medicaid  °[facility] [address]  ° [place] 1-800-659-3381 Substance Abuse/Addiction Treatment  ° °[place] Behavioral Health Resources °Organization         Address  Phone  Notes  °[facility]  (888) 581-9988   °Julie Brannon, PhD [address] [place], [place]   (336) 349-5553 or (336) 951-0000   °[facility]   [address] °[place], [place] (336) 349-4454   °[facility] [address], [place], [place] (336) 342-8316 Insurance/Medicaid/sponsorship through [facility]  °[facility] [address] [place]                                    [place], [place] (336) 342-8316 Therapy/tele-psych/case  °[facility] [address]  ° [place], [place] (336)  349-2233    °Dr. Arfeen  (336) 349-4544   °[facility] [place]  [employer] [place] [facility] 1) [address], [place] °2) [address], [place] °3)  [address], [place] (336) 349-3220 °(336) 342-7768 ° °(336) 342-8140   °[place] [facility] (336) 342-1394 or (336) 342-3537 (After Hours)    ° ° ° °

## 2014-02-14 NOTE — ED Provider Notes (Signed)
I saw and evaluated the patient, reviewed the resident's note and I agree with the findings and plan.   .Face to face Exam:  General:  Awake HEENT:  Atraumatic Resp:  Normal effort Abd:  Nondistended Neuro:No focal weakness  Nelia Shiobert L [place], MD 02/14/14 [phone]

## 2014-02-15 LAB — URINE CULTURE
COLONY COUNT: NO GROWTH
CULTURE: NO GROWTH

## 2014-02-16 ENCOUNTER — Encounter (HOSPITAL_BASED_OUTPATIENT_CLINIC_OR_DEPARTMENT_OTHER): Payer: Self-pay | Admitting: Emergency Medicine

## 2014-02-16 ENCOUNTER — Emergency Department (HOSPITAL_BASED_OUTPATIENT_CLINIC_OR_DEPARTMENT_OTHER)
Admission: EM | Admit: 2014-02-16 | Discharge: 2014-02-16 | Disposition: A | Discharge status: Home / Self Care | Payer: Self-pay | Attending: Emergency Medicine | Admitting: Emergency Medicine

## 2014-02-16 DIAGNOSIS — E162 Hypoglycemia, unspecified: Secondary | ICD-10-CM

## 2014-02-16 DIAGNOSIS — F17200 Nicotine dependence, unspecified, uncomplicated: Secondary | ICD-10-CM | POA: Insufficient documentation

## 2014-02-16 DIAGNOSIS — R143 Flatulence: Secondary | ICD-10-CM

## 2014-02-16 DIAGNOSIS — D649 Anemia, unspecified: Secondary | ICD-10-CM | POA: Insufficient documentation

## 2014-02-16 DIAGNOSIS — Z880 Allergy status to penicillin: Secondary | ICD-10-CM | POA: Insufficient documentation

## 2014-02-16 DIAGNOSIS — R141 Gas pain: Secondary | ICD-10-CM | POA: Insufficient documentation

## 2014-02-16 DIAGNOSIS — Z8719 Personal history of other diseases of the digestive system: Secondary | ICD-10-CM | POA: Insufficient documentation

## 2014-02-16 DIAGNOSIS — F3289 Other specified depressive episodes: Secondary | ICD-10-CM | POA: Insufficient documentation

## 2014-02-16 DIAGNOSIS — Z79899 Other long term (current) drug therapy: Secondary | ICD-10-CM | POA: Insufficient documentation

## 2014-02-16 DIAGNOSIS — F411 Generalized anxiety disorder: Secondary | ICD-10-CM | POA: Insufficient documentation

## 2014-02-16 DIAGNOSIS — R142 Eructation: Secondary | ICD-10-CM

## 2014-02-16 DIAGNOSIS — F329 Major depressive disorder, single episode, unspecified: Secondary | ICD-10-CM | POA: Insufficient documentation

## 2014-02-16 DIAGNOSIS — E1169 Type 2 diabetes mellitus with other specified complication: Secondary | ICD-10-CM | POA: Insufficient documentation

## 2014-02-16 DIAGNOSIS — Z792 Long term (current) use of antibiotics: Secondary | ICD-10-CM | POA: Insufficient documentation

## 2014-02-16 LAB — BASIC METABOLIC PANEL
BUN: 11 mg/dL (ref 6–23)
CALCIUM: 10 mg/dL (ref 8.4–10.5)
CO2: 22 mEq/L (ref 19–32)
Chloride: 103 mEq/L (ref 96–112)
Creatinine, Ser: 0.6 mg/dL (ref 0.50–1.10)
GFR calc Af Amer: >90 mL/min (ref >90)
GFR calc non Af Amer: >90 mL/min (ref >90)
GLUCOSE: 111 mg/dL — AB (ref 70–99)
Potassium: 3.8 mEq/L (ref 3.7–5.3)
SODIUM: 139 meq/L (ref 137–147)

## 2014-02-16 LAB — CBG MONITORING, ED
Glucose-Capillary: 113 mg/dL — ABNORMAL HIGH (ref 70–99)
Glucose-Capillary: 126 mg/dL — ABNORMAL HIGH (ref 70–99)

## 2014-02-16 MED ORDER — ONDANSETRON 4 MG PO TBDP
4.0000 mg | ORAL_TABLET | Freq: Once | ORAL | Status: AC
  Administered 2014-02-16: 4 mg via ORAL
  Filled 2014-02-16: qty 1

## 2014-02-16 NOTE — ED Notes (Signed)
Pt was eating potato chips and has an unopened gingerale-advised NPO until further notice

## 2014-02-16 NOTE — ED Provider Notes (Signed)
CSN: [id number]     Arrival date & time 02/16/14  1405 History   First MD Initiated Contact with Patient 02/16/14 1417     Chief Complaint  Patient presents with  . Emesis     (Consider location/radiation/quality/duration/timing/severity/associated sxs/prior Treatment) HPI Comments: Pt states that she is here because her blood sugar got to 65 and she vomited times one. Pt is a newly diagnosed diabetic and has been being treated for diverticulitis. Denies fever, diarrhea or abdominal pain. Pt states that she is at [facility] and they sent her here after given her candy and oj. Pt states that he is hungry and would like to eat  The history is provided by the patient. No language interpreter was used.    Past Medical History  Diagnosis Date  . Gallstones   . Pancreatitis   . ETOH abuse   . Shortness of breath     on exertion  . Pyloric stenosis   . Gastritis   . Esophagitis   . Anemia   . UGIB (upper gastrointestinal bleed)   . Thrombocytopenia   . Gallstones   . Anxiety   . Depression   . Headache    Past Surgical History  Procedure Laterality Date  . Total abdominal hysterectomy      for hx uterine fibroids  . Cesarean section      two times  . Foot surgery      surgery on joint in RT foot, pt states metal in foot  . Esophagogastroduodenoscopy  03/31/2012    Procedure: ESOPHAGOGASTRODUODENOSCOPY (EGD);  Surgeon: Lafayette Dragon, MD;  Location: [facility];  Service: Endoscopy;  Laterality: N/A;   No family history on file. History  Substance Use Topics  . Smoking status: Current Every Day Smoker -- 0.50 packs/day  . Smokeless tobacco: Not on file  . Alcohol Use: No     Comment: [facility]   OB History   Grav Para Term Preterm Abortions TAB SAB Ect Mult Living                 Review of Systems  Constitutional: Negative for fever.  Respiratory: Negative.   Cardiovascular: Negative.       Allergies  Motrin and Penicillins  Home Medications    Prior to Admission medications   Medication Sig Start Date End Date Taking? Authorizing Provider  Blood Glucose Monitoring Suppl KIT Please provide monitor,  Strips, needles and fingerstick device.   Dispense trade size strips, needles,  (Pharmacist please provide with affordable option) 02/10/14   Fransico Meadow, PA-C  ciprofloxacin (CIPRO) 500 MG tablet Take 1 tablet (500 mg total) by mouth 2 (two) times daily. 02/10/14   Fransico Meadow, PA-C  citalopram (CELEXA) 10 MG tablet Take 10 mg by mouth daily.    Historical Provider, MD  folic acid (FOLVITE) 1 MG tablet Take 1 mg by mouth daily.    Historical Provider, MD  Melatonin 3 MG CAPS Take by mouth.    Historical Provider, MD  metFORMIN (GLUCOPHAGE) 1000 MG tablet Take 1 tablet (1,000 mg total) by mouth 2 (two) times daily. 02/10/14   Fransico Meadow, PA-C  metroNIDAZOLE (FLAGYL) 500 MG tablet Take 1 tablet (500 mg total) by mouth 2 (two) times daily. 02/10/14   Fransico Meadow, PA-C  Multiple Vitamin (MULTIVITAMIN WITH MINERALS) TABS Take 1 tablet by mouth daily. 04/02/12   Samuella Cota, MD  omeprazole (PRILOSEC) 40 MG capsule Take 80 mg by mouth  daily. 04/02/12 04/02/13  Samuella Cota, MD  ondansetron (ZOFRAN ODT) 4 MG disintegrating tablet Take 1 tablet (4 mg total) by mouth every 8 (eight) hours as needed for nausea. 02/13/14   Leeanne Rio, MD  potassium chloride SA (K-DUR,KLOR-CON) 20 MEQ tablet Take 2 tablets (40 mEq total) by mouth daily. 04/02/12 04/02/13  Samuella Cota, MD  propranolol (INDERAL) 60 MG tablet Take 120 mg by mouth 2 (two) times daily.    Historical Provider, MD  QUEtiapine (SEROQUEL) 50 MG tablet Take 50 mg by mouth at bedtime.    Historical Provider, MD  sucralfate (CARAFATE) 1 GM/10ML suspension Take 10 mLs (1 g total) by mouth 4 (four) times daily -  with meals and at bedtime. 02/13/14 03/15/14  Leeanne Rio, MD  topiramate (TOPAMAX) 50 MG tablet Take 50 mg by mouth 2 (two) times daily.    Historical  Provider, MD   BP 109/61  Pulse 88  Temp(Src) 98.9 F (37.2 C) (Oral)  Resp 16  SpO2 100% Physical Exam  Nursing note and vitals reviewed. Constitutional: She is oriented to person, place, and time. She appears well-developed and well-nourished.  HENT:  Head: Normocephalic and atraumatic.  Right Ear: External ear normal.  Left Ear: External ear normal.  Cardiovascular: Normal rate and regular rhythm.   Pulmonary/Chest: Effort normal and breath sounds normal.  Abdominal: Soft. Bowel sounds are normal. She exhibits distension. There is no tenderness.  Musculoskeletal: Normal range of motion.  Neurological: She is alert and oriented to person, place, and time.  Skin: Skin is warm and dry.  Psychiatric: She has a normal mood and affect.    ED Course  Procedures (including critical care time) Labs Review Labs Reviewed  BASIC METABOLIC PANEL - Abnormal; Notable for the following:    Glucose, Bld 111 (*)    All other components within normal limits  CBG MONITORING, ED - Abnormal; Notable for the following:    Glucose-Capillary 113 (*)    All other components within normal limits  CBG MONITORING, ED - Abnormal; Notable for the following:    Glucose-Capillary 126 (*)    All other components within normal limits    Imaging Review No results found.   EKG Interpretation None      MDM   Final diagnoses:  Hypoglycemia    Pt is holding blood sugar here and is tolerating po at this time. Pt is okay to follow up as needed. Pt abdomen is benign. Pt okay to go back to [facility]   Glendell Docker, NP 02/16/14 1700

## 2014-02-16 NOTE — ED Notes (Signed)
Pt states she vomited x 1 just PTA BS 65-was given candy and OJ-[facility]

## 2014-02-16 NOTE — Discharge Instructions (Signed)
Hypoglycemia °Hypoglycemia occurs when the glucose in your blood is too low. Glucose is a type of sugar that is your body's main energy source. Hormones, such as insulin and glucagon, control the level of glucose in the blood. Insulin lowers blood glucose and glucagon increases blood glucose. Having too much insulin in your blood stream, or not eating enough food containing sugar, can result in hypoglycemia. Hypoglycemia can happen to people with or without diabetes. It can develop quickly and can be a medical emergency.  °CAUSES  °· Missing or delaying meals. °· Not eating enough carbohydrates at meals. °· Taking too much diabetes medicine. °· Not timing your oral diabetes medicine or insulin doses with meals, snacks, and exercise. °· Nausea and vomiting. °· Certain medicines. °· Severe illnesses, such as hepatitis, kidney disorders, and certain eating disorders. °· Increased activity or exercise without eating something extra or adjusting medicines. °· Drinking too much alcohol. °· A nerve disorder that affects body functions like your heart rate, blood pressure, and digestion (autonomic neuropathy). °· A condition where the stomach muscles do not function properly (gastroparesis). Therefore, medicines and food may not absorb properly. °· Rarely, a tumor of the pancreas can produce too much insulin. °SYMPTOMS  °· Hunger. °· Sweating (diaphoresis). °· Change in body temperature. °· Shakiness. °· Headache. °· Anxiety. °· Lightheadedness. °· Irritability. °· Difficulty concentrating. °· Dry mouth. °· Tingling or numbness in the hands or feet. °· Restless sleep or sleep disturbances. °· Altered speech and coordination. °· Change in mental status. °· Seizures or prolonged convulsions. °· Combativeness. °· Drowsiness (lethargic). °· Weakness. °· Increased heart rate or palpitations. °· Confusion. °· Pale, gray skin color. °· Blurred or double vision. °· Fainting. °DIAGNOSIS  °A physical exam and medical history will be  performed. Your caregiver may make a diagnosis based on your symptoms. Blood tests and other lab tests may be performed to confirm a diagnosis. Once the diagnosis is made, your caregiver will see if your signs and symptoms go away once your blood glucose is raised.  °TREATMENT  °Usually, you can easily treat your hypoglycemia when you notice symptoms. °· Check your blood glucose. If it is less than 70 mg/dl, take one of the following:   °¨ 3-4 glucose tablets.   °¨ ½ cup juice.   °¨ ½ cup regular soda.   °¨ 1 cup skim milk.   °¨ ½-1 tube of glucose gel.   °¨ 5-6 hard candies.   °· Avoid high-fat drinks or food that may delay a rise in blood glucose levels. °· Do not take more than the recommended amount of sugary foods, drinks, gel, or tablets. Doing so will cause your blood glucose to go too high.   °· Wait 10-15 minutes and recheck your blood glucose. If it is still less than 70 mg/dl or below your target range, repeat treatment.   °· Eat a snack if it is more than 1 hour until your next meal.   °There may be a time when your blood glucose may go so low that you are unable to treat yourself at home when you start to notice symptoms. You may need someone to help you. You may even faint or be unable to swallow. If you cannot treat yourself, someone will need to bring you to the hospital.  °HOME CARE INSTRUCTIONS °· If you have diabetes, follow your diabetes management plan by: °¨ Taking your medicines as directed. °¨ Following your exercise plan. °¨ Following your meal plan. Do not skip meals. Eat on time. °¨ Testing your blood   glucose regularly. Check your blood glucose before and after exercise. If you exercise longer or different than usual, be sure to check blood glucose more frequently. °¨ Wearing your medical alert jewelry that says you have diabetes. °· Identify the cause of your hypoglycemia. Then, develop ways to prevent the recurrence of hypoglycemia. °· Do not take a hot bath or shower right after an  insulin shot. °· Always carry treatment with you. Glucose tablets are the easiest to carry. °· If you are going to drink alcohol, drink it only with meals. °· Tell friends or family members ways to keep you safe during a seizure. This may include removing hard or sharp objects from the area or turning you on your side. °· Maintain a healthy weight. °SEEK MEDICAL CARE IF:  °· You are having problems keeping your blood glucose in your target range. °· You are having frequent episodes of hypoglycemia. °· You feel you might be having side effects from your medicines. °· You are not sure why your blood glucose is dropping so low. °· You notice a change in vision or a new problem with your vision. °SEEK IMMEDIATE MEDICAL CARE IF:  °· Confusion develops. °· A change in mental status occurs. °· The inability to swallow develops. °· Fainting occurs. °Document Released: 08/11/2005 Document Revised: 08/16/2013 Document Reviewed: 12/08/2011 °[employer]® Patient Information ©2015 [employer]. This information is not intended to replace advice given to you by your health care provider. Make sure you discuss any questions you have with your health care provider. ° °

## 2014-02-17 NOTE — ED Provider Notes (Signed)
Medical screening examination/treatment/procedure(s) were performed by non-physician practitioner and as supervising physician I was immediately available for consultation/collaboration.   EKG Interpretation None        Gilda Creasehristopher J. Pollina, MD 02/17/14 [phone]

## 2020-08-13 ENCOUNTER — Ambulatory Visit (HOSPITAL_COMMUNITY): Payer: Medicare HMO | Admitting: Behavioral Health

## 2021-04-04 ENCOUNTER — Other Ambulatory Visit: Payer: Self-pay

## 2021-04-04 ENCOUNTER — Emergency Department (HOSPITAL_COMMUNITY)
Admission: EM | Admit: 2021-04-04 | Discharge: 2021-04-04 | Disposition: A | Discharge status: Home / Self Care | Payer: Medicare HMO | Attending: Emergency Medicine | Admitting: Emergency Medicine

## 2021-04-04 ENCOUNTER — Encounter (HOSPITAL_COMMUNITY): Payer: Self-pay

## 2021-04-04 ENCOUNTER — Emergency Department (HOSPITAL_COMMUNITY): Payer: Medicare HMO

## 2021-04-04 DIAGNOSIS — R110 Nausea: Secondary | ICD-10-CM | POA: Diagnosis not present

## 2021-04-04 DIAGNOSIS — R109 Unspecified abdominal pain: Secondary | ICD-10-CM | POA: Insufficient documentation

## 2021-04-04 DIAGNOSIS — F17210 Nicotine dependence, cigarettes, uncomplicated: Secondary | ICD-10-CM | POA: Insufficient documentation

## 2021-04-04 DIAGNOSIS — R519 Headache, unspecified: Secondary | ICD-10-CM | POA: Insufficient documentation

## 2021-04-04 DIAGNOSIS — R739 Hyperglycemia, unspecified: Secondary | ICD-10-CM

## 2021-04-04 LAB — BASIC METABOLIC PANEL
Anion gap: 10 (ref 5–15)
BUN: 23 mg/dL — ABNORMAL HIGH (ref 6–20)
CO2: 20 mmol/L — ABNORMAL LOW (ref 22–32)
Calcium: 9.6 mg/dL (ref 8.9–10.3)
Chloride: 102 mmol/L (ref 98–111)
Creatinine, Ser: 1.22 mg/dL — ABNORMAL HIGH (ref 0.44–1.00)
GFR, Estimated: 51 mL/min — ABNORMAL LOW (ref >60)
Glucose, Bld: 563 mg/dL (ref 70–99)
Potassium: 4.4 mmol/L (ref 3.5–5.1)
Sodium: 132 mmol/L — ABNORMAL LOW (ref 135–145)

## 2021-04-04 LAB — CBC WITH DIFFERENTIAL/PLATELET
Abs Immature Granulocytes: 0.03 10*3/uL (ref 0.00–0.07)
Basophils Absolute: 0 10*3/uL (ref 0.0–0.1)
Basophils Relative: 0 %
Eosinophils Absolute: 0.1 10*3/uL (ref 0.0–0.5)
Eosinophils Relative: 1 %
HCT: 40.7 % (ref 36.0–46.0)
Hemoglobin: 13.1 g/dL (ref 12.0–15.0)
Immature Granulocytes: 0 %
Lymphocytes Relative: 24 %
Lymphs Abs: 2.5 10*3/uL (ref 0.7–4.0)
MCH: 27.1 pg (ref 26.0–34.0)
MCHC: 32.2 g/dL (ref 30.0–36.0)
MCV: 84.1 fL (ref 80.0–100.0)
Monocytes Absolute: 0.3 10*3/uL (ref 0.1–1.0)
Monocytes Relative: 3 %
Neutro Abs: 7.6 10*3/uL (ref 1.7–7.7)
Neutrophils Relative %: 72 %
Platelets: 117 10*3/uL — ABNORMAL LOW (ref 150–400)
RBC: 4.84 MIL/uL (ref 3.87–5.11)
RDW: 14.2 % (ref 11.5–15.5)
WBC: 10.5 10*3/uL (ref 4.0–10.5)
nRBC: 0 % (ref 0.0–0.2)

## 2021-04-04 LAB — URINALYSIS, ROUTINE W REFLEX MICROSCOPIC
Bacteria, UA: NONE SEEN
Bilirubin Urine: NEGATIVE
Glucose, UA: >=500 mg/dL — AB
Hgb urine dipstick: NEGATIVE
Ketones, ur: NEGATIVE mg/dL
Leukocytes,Ua: NEGATIVE
Nitrite: NEGATIVE
Protein, ur: NEGATIVE mg/dL
Specific Gravity, Urine: 1.028 (ref 1.005–1.030)
pH: 6 (ref 5.0–8.0)

## 2021-04-04 LAB — CBG MONITORING, ED
Glucose-Capillary: 379 mg/dL — ABNORMAL HIGH (ref 70–99)
Glucose-Capillary: 220 mg/dL — ABNORMAL HIGH (ref 70–99)

## 2021-04-04 MED ORDER — MORPHINE SULFATE (PF) 4 MG/ML IV SOLN
4.0000 mg | Freq: Once | INTRAVENOUS | Status: AC
  Administered 2021-04-04: 4 mg via INTRAVENOUS
  Filled 2021-04-04: qty 1

## 2021-04-04 MED ORDER — IOHEXOL 350 MG/ML SOLN
100.0000 mL | Freq: Once | INTRAVENOUS | Status: AC | PRN
  Administered 2021-04-04: 75 mL via INTRAVENOUS

## 2021-04-04 MED ORDER — INSULIN ASPART 100 UNIT/ML IJ SOLN
5.0000 [IU] | Freq: Once | INTRAMUSCULAR | Status: AC
  Administered 2021-04-04: 5 [IU] via SUBCUTANEOUS
  Filled 2021-04-04: qty 0.05

## 2021-04-04 MED ORDER — INSULIN ASPART 100 UNIT/ML IJ SOLN
10.0000 [IU] | Freq: Once | INTRAMUSCULAR | Status: DC
  Filled 2021-04-04: qty 0.1

## 2021-04-04 MED ORDER — LACTATED RINGERS IV BOLUS
20.0000 mL/kg | Freq: Once | INTRAVENOUS | Status: AC
  Administered 2021-04-04: 1360 mL via INTRAVENOUS

## 2021-04-04 NOTE — Discharge Instructions (Signed)
You have symptoms likely due to elevated blood sugar.  Please call and follow-up closely with your primary care doctor for further managements of your diabetes.  Better control blood sugar will likely make you feel better.  Return to ER if your symptoms worsen or if you have any other concerns

## 2021-04-04 NOTE — ED Provider Notes (Signed)
[facility] DEPT Provider Note   CSN: [id number] Arrival date & time: 04/04/21  1056     History Chief Complaint  Patient presents with   Hyperglycemia    Lisa Stuart is a 60 y.o. female.  The history is provided by the patient and medical records. No language interpreter was used.  Hyperglycemia  59 year old female significant history of alcohol abuse, anxiety, depression, brought here via EMS for evaluation of high blood sugar.  Patient report for the past week she has not been feeling well.  She endorsed throbbing headache, having slurred speech, difficulty thinking, abdominal cramping, mild nausea, and overall not feeling well.  Does endorse polyuria and polydipsia.  She does have a libre glucose monitoring device but states that it stopped working yesterday.  She went to urgent care today for her symptoms and report blood sugar was elevated in the 500s and patient sent here for further evaluation.  Patient also reports concern for stroke stating that she has family member that died from a stroke recently which concerns her.  She does not complain of any focal numbness or focal weakness.  She admits to tobacco use but states she has been alcohol free for nearly a year.  No complaints of chest pain or trouble breathing.  No dysuria.  Headache is moderate in severity waxing waning and gradual onset  Past Medical History:  Diagnosis Date   Anemia    Anxiety    Depression    Esophagitis    ETOH abuse    Gallstones    Gallstones    Gastritis    Headache    Pancreatitis    Pyloric stenosis    Shortness of breath    on exertion   Thrombocytopenia (HCC)    UGIB (upper gastrointestinal bleed)     Patient Active Problem List   Diagnosis Date Noted   Gastric outlet obstruction 03/31/2012   Hematemesis 03/31/2012   Reflux esophagitis 03/31/2012   Generalized abdominal pain 03/31/2012   Normocytic anemia 03/31/2012   Thrombocytopenia ([place])  03/31/2012   Hypomagnesemia 03/31/2012   Alcohol abuse 03/29/2012   GI bleed 03/29/2012   Tachycardia 03/29/2012    Past Surgical History:  Procedure Laterality Date   CESAREAN SECTION     two times   ESOPHAGOGASTRODUODENOSCOPY  03/31/2012   Procedure: ESOPHAGOGASTRODUODENOSCOPY (EGD);  Surgeon: Lafayette Dragon, MD;  Location: [facility];  Service: Endoscopy;  Laterality: N/A;   FOOT SURGERY     surgery on joint in RT foot, pt states metal in foot   TOTAL ABDOMINAL HYSTERECTOMY     for hx uterine fibroids     OB History   No obstetric history on file.     History reviewed. No pertinent family history.  Social History   Tobacco Use   Smoking status: Every Day    Packs/day: 0.50    Types: Cigarettes  Substance Use Topics   Alcohol use: No    Comment: At [facility] for alcohol   Drug use: No    Home Medications Prior to Admission medications   Medication Sig Start Date End Date Taking? Authorizing Provider  Blood Glucose Monitoring Suppl KIT Please provide monitor,  Strips, needles and fingerstick device.   Dispense trade size strips, needles,  (Pharmacist please provide with affordable option) 02/10/14   Fransico Meadow, PA-C  ciprofloxacin (CIPRO) 500 MG tablet Take 1 tablet (500 mg total) by mouth 2 (two) times daily. 02/10/14   Fransico Meadow, PA-C  citalopram (  CELEXA) 10 MG tablet Take 10 mg by mouth daily.    [provider]  folic acid (FOLVITE) 1 MG tablet Take 1 mg by mouth daily.    [provider]  Melatonin 3 MG CAPS Take by mouth.    [provider]  metFORMIN (GLUCOPHAGE) 1000 MG tablet Take 1 tablet (1,000 mg total) by mouth 2 (two) times daily. 02/10/14   Fransico Meadow, PA-C  metroNIDAZOLE (FLAGYL) 500 MG tablet Take 1 tablet (500 mg total) by mouth 2 (two) times daily. 02/10/14   Fransico Meadow, PA-C  Multiple Vitamin (MULTIVITAMIN WITH MINERALS) TABS Take 1 tablet by mouth daily. 04/02/12   Samuella Cota, MD  omeprazole  (PRILOSEC) 40 MG capsule Take 80 mg by mouth daily. 04/02/12 04/02/13  Samuella Cota, MD  ondansetron (ZOFRAN ODT) 4 MG disintegrating tablet Take 1 tablet (4 mg total) by mouth every 8 (eight) hours as needed for nausea. 02/13/14   Leeanne Rio, MD  potassium chloride SA (K-DUR,KLOR-CON) 20 MEQ tablet Take 2 tablets (40 mEq total) by mouth daily. 04/02/12 04/02/13  Samuella Cota, MD  propranolol (INDERAL) 60 MG tablet Take 120 mg by mouth 2 (two) times daily.    [provider]  QUEtiapine (SEROQUEL) 50 MG tablet Take 50 mg by mouth at bedtime.    [provider]  sucralfate (CARAFATE) 1 GM/10ML suspension Take 10 mLs (1 g total) by mouth 4 (four) times daily -  with meals and at bedtime. 02/13/14 03/15/14  Leeanne Rio, MD  topiramate (TOPAMAX) 50 MG tablet Take 50 mg by mouth 2 (two) times daily.    [provider]    Allergies    Motrin [ibuprofen] and Penicillins  Review of Systems   Review of Systems  All other systems reviewed and are negative.  Physical Exam Updated Vital Signs BP (!) 132/99 (BP Location: Right Arm)   Pulse 75   Temp 98.1 F (36.7 C) (Oral)   Resp 20   Ht 5' 1" (1.549 m)   Wt 68 kg   SpO2 100%   BMI 28.34 kg/m   Physical Exam Vitals and nursing note reviewed.  Constitutional:      General: She is not in acute distress.    Appearance: She is well-developed.     Comments: Patient is talkative resting comfortably in bed appears to be in no acute discomfort.  HENT:     Head: Atraumatic.  Eyes:     Conjunctiva/sclera: Conjunctivae normal.  Cardiovascular:     Rate and Rhythm: Normal rate and regular rhythm.     Pulses: Normal pulses.     Heart sounds: Normal heart sounds.  Pulmonary:     Effort: Pulmonary effort is normal.  Abdominal:     Palpations: Abdomen is soft.     Tenderness: There is no abdominal tenderness.  Musculoskeletal:     Cervical back: Neck supple.     Right lower leg: No edema.     Left  lower leg: No edema.     Comments: 5 out of 5 strength to all 4 extremities  Skin:    Findings: No rash.  Neurological:     Mental Status: She is alert. Mental status is at baseline.     GCS: GCS eye subscore is 4. GCS verbal subscore is 5. GCS motor subscore is 6.     Cranial Nerves: No cranial nerve deficit, dysarthria or facial asymmetry.     Sensory: Sensation is  intact.     Motor: Motor function is intact.  Psychiatric:        Mood and Affect: Mood normal.    ED Results / Procedures / Treatments   Labs (all labs ordered are listed, but only abnormal results are displayed) Labs Reviewed  BASIC METABOLIC PANEL - Abnormal; Notable for the following components:      Result Value   Sodium 132 (*)    CO2 20 (*)    Glucose, Bld 563 (*)    BUN 23 (*)    Creatinine, Ser 1.22 (*)    GFR, Estimated 51 (*)    All other components within normal limits  CBC WITH DIFFERENTIAL/PLATELET - Abnormal; Notable for the following components:   Platelets 117 (*)    All other components within normal limits  URINALYSIS, ROUTINE W REFLEX MICROSCOPIC - Abnormal; Notable for the following components:   Glucose, UA >=500 (*)    All other components within normal limits  CBG MONITORING, ED - Abnormal; Notable for the following components:   Glucose-Capillary 379 (*)    All other components within normal limits  CBG MONITORING, ED    EKG None  Radiology CT Angio Head W or Wo Contrast  Result Date: 04/04/2021 CLINICAL DATA:  Neuro deficit, acute, stroke suspected EXAM: CT ANGIOGRAPHY HEAD AND NECK TECHNIQUE: Multidetector CT imaging of the head and neck was performed using the standard protocol during bolus administration of intravenous contrast. Multiplanar CT image reconstructions and MIPs were obtained to evaluate the vascular anatomy. Carotid stenosis measurements (when applicable) are obtained utilizing NASCET criteria, using the distal internal carotid diameter as the denominator. CONTRAST:   67m OMNIPAQUE IOHEXOL 350 MG/ML SOLN COMPARISON:  CT head November 2021 FINDINGS: CT HEAD Brain: There is no acute intracranial hemorrhage, mass effect, or edema. Gray-white differentiation is preserved. There is no extra-axial fluid collection. Ventricles and sulci are within normal limits in size and configuration. Vascular: No hyperdense vessel. Skull: Findings are Sinuses/Orbits: No acute finding. Other: None. Review of the MIP images confirms the above findings CTA NECK Aortic arch: Great vessel origins are patent. Right carotid system: Patent. Minor calcified plaque along the proximal internal carotid without stenosis. Left carotid system: Patent. Mild calcified plaque along the proximal internal carotid without stenosis. Vertebral arteries: Patent and codominant.  No stenosis. Skeleton: Mild cervical spine degenerative changes. Other neck: Unremarkable within limitation of motion artifact. Upper chest: No apical lung mass. Review of the MIP images confirms the above findings CTA HEAD Anterior circulation: Intracranial internal carotid arteries are patent with mild calcified plaque. Anterior and middle cerebral arteries are patent. Posterior circulation: Intracranial vertebral arteries are patent. Basilar artery is patent. Major cerebellar artery origins are patent. Bilateral posterior communicating arteries are present. Posterior cerebral arteries are patent. Venous sinuses: Patent as allowed by contrast bolus timing. Review of the MIP images confirms the above findings IMPRESSION: No acute intracranial abnormality. No large vessel occlusion, hemodynamically significant stenosis, or evidence of dissection. Electronically Signed   By: PMacy MisM.D.   On: 04/04/2021 14:27   CT Angio Neck W and/or Wo Contrast  Result Date: 04/04/2021 CLINICAL DATA:  Neuro deficit, acute, stroke suspected EXAM: CT ANGIOGRAPHY HEAD AND NECK TECHNIQUE: Multidetector CT imaging of the head and neck was performed using the  standard protocol during bolus administration of intravenous contrast. Multiplanar CT image reconstructions and MIPs were obtained to evaluate the vascular anatomy. Carotid stenosis measurements (when applicable) are obtained utilizing NASCET criteria, using the distal internal  carotid diameter as the denominator. CONTRAST:  58m OMNIPAQUE IOHEXOL 350 MG/ML SOLN COMPARISON:  CT head November 2021 FINDINGS: CT HEAD Brain: There is no acute intracranial hemorrhage, mass effect, or edema. Gray-white differentiation is preserved. There is no extra-axial fluid collection. Ventricles and sulci are within normal limits in size and configuration. Vascular: No hyperdense vessel. Skull: Findings are Sinuses/Orbits: No acute finding. Other: None. Review of the MIP images confirms the above findings CTA NECK Aortic arch: Great vessel origins are patent. Right carotid system: Patent. Minor calcified plaque along the proximal internal carotid without stenosis. Left carotid system: Patent. Mild calcified plaque along the proximal internal carotid without stenosis. Vertebral arteries: Patent and codominant.  No stenosis. Skeleton: Mild cervical spine degenerative changes. Other neck: Unremarkable within limitation of motion artifact. Upper chest: No apical lung mass. Review of the MIP images confirms the above findings CTA HEAD Anterior circulation: Intracranial internal carotid arteries are patent with mild calcified plaque. Anterior and middle cerebral arteries are patent. Posterior circulation: Intracranial vertebral arteries are patent. Basilar artery is patent. Major cerebellar artery origins are patent. Bilateral posterior communicating arteries are present. Posterior cerebral arteries are patent. Venous sinuses: Patent as allowed by contrast bolus timing. Review of the MIP images confirms the above findings IMPRESSION: No acute intracranial abnormality. No large vessel occlusion, hemodynamically significant stenosis, or  evidence of dissection. Electronically Signed   By: PMacy MisM.D.   On: 04/04/2021 14:27    Procedures Procedures   Medications Ordered in ED Medications  lactated ringers bolus 1,360 mL (1,360 mLs Intravenous New Bag/Given 04/04/21 1134)  morphine 4 MG/ML injection 4 mg (4 mg Intravenous Given 04/04/21 1315)  insulin aspart (novoLOG) injection 5 Units (5 Units Subcutaneous Given 04/04/21 1333)  iohexol (OMNIPAQUE) 350 MG/ML injection 100 mL (75 mLs Intravenous Contrast Given 04/04/21 1357)    ED Course  I have reviewed the triage vital signs and the nursing notes.  Pertinent labs & imaging results that were available during my care of the patient were reviewed by me and considered in my medical decision making (see chart for details).    MDM Rules/Calculators/A&P                           BP (!) 132/99 (BP Location: Right Arm)   Pulse 75   Temp 98.1 F (36.7 C) (Oral)   Resp 20   Ht 5' 1" (1.549 m)   Wt 68 kg   SpO2 100%   BMI 28.34 kg/m   Final Clinical Impression(s) / ED Diagnoses Final diagnoses:  Hyperglycemia    Rx / DC Orders ED Discharge Orders     None      11:32 AM Patient here due to elevated blood sugar who is noncompliant with her medication.  EMS documented a CBG of 580.  Patient also endorsed having headache, slurred speech, confusion and reports concerning for stroke.  She does not have any focal neurodeficit.  She is answering questions appropriately.  I suspect her symptom is likely due to hyperglycemic state.  Work-up initiated.  12:28 PM CBG today is 563.  Fortunately normal anion gap.  Some evidence of AKI with a creatinine of 1.22 and a BUN of 23.  Patient will receive IV fluid, 10 units of insulin, and will be monitoring closely.  UA without evidence of ketone.  2:36 PM Head and neck CT angiogram obtained without any concerning feature.  As mentioned earlier patient does not  have any focal neurodeficit.  I suspect her headache, difficulty  speaking and confusion is likely secondary to her hyperglycemic state.  After IV fluid, CBG did improve.  Insulin was given.  CBG improved to 220.  At this time patient stable for discharge.  Outpatient follow-up closely with PCP for better management of her hyperglycemic state.  Return precaution given.   Domenic Moras, PA-C 04/04/21 1534    Arnaldo Natal, MD 04/04/21 1556

## 2021-04-04 NOTE — ED Triage Notes (Signed)
[facility] reports pt coming from [facility] being seen for a headache, checked her CBG and was reading high, EMS reading 580. Headaches for the past few days. Pt is non complaint with meds.

## 2021-05-27 ENCOUNTER — Other Ambulatory Visit: Payer: Self-pay

## 2021-05-27 ENCOUNTER — Emergency Department (HOSPITAL_BASED_OUTPATIENT_CLINIC_OR_DEPARTMENT_OTHER)
Admission: EM | Admit: 2021-05-27 | Discharge: 2021-05-28 | Disposition: A | Discharge status: Home / Self Care | Payer: Medicare HMO | Attending: Student | Admitting: Student

## 2021-05-27 ENCOUNTER — Emergency Department (HOSPITAL_BASED_OUTPATIENT_CLINIC_OR_DEPARTMENT_OTHER): Payer: Medicare HMO

## 2021-05-27 ENCOUNTER — Encounter (HOSPITAL_BASED_OUTPATIENT_CLINIC_OR_DEPARTMENT_OTHER): Payer: Self-pay | Admitting: Emergency Medicine

## 2021-05-27 DIAGNOSIS — G43809 Other migraine, not intractable, without status migrainosus: Secondary | ICD-10-CM | POA: Diagnosis not present

## 2021-05-27 DIAGNOSIS — Z79899 Other long term (current) drug therapy: Secondary | ICD-10-CM | POA: Insufficient documentation

## 2021-05-27 DIAGNOSIS — L0291 Cutaneous abscess, unspecified: Secondary | ICD-10-CM

## 2021-05-27 DIAGNOSIS — R519 Headache, unspecified: Secondary | ICD-10-CM | POA: Diagnosis not present

## 2021-05-27 DIAGNOSIS — N764 Abscess of vulva: Secondary | ICD-10-CM | POA: Diagnosis present

## 2021-05-27 DIAGNOSIS — F17210 Nicotine dependence, cigarettes, uncomplicated: Secondary | ICD-10-CM | POA: Insufficient documentation

## 2021-05-27 DIAGNOSIS — H53149 Visual discomfort, unspecified: Secondary | ICD-10-CM | POA: Diagnosis not present

## 2021-05-27 DIAGNOSIS — R739 Hyperglycemia, unspecified: Secondary | ICD-10-CM | POA: Diagnosis not present

## 2021-05-27 DIAGNOSIS — Z794 Long term (current) use of insulin: Secondary | ICD-10-CM | POA: Insufficient documentation

## 2021-05-27 DIAGNOSIS — R1084 Generalized abdominal pain: Secondary | ICD-10-CM | POA: Diagnosis not present

## 2021-05-27 LAB — CBC WITH DIFFERENTIAL/PLATELET
Abs Immature Granulocytes: 0.01 10*3/uL (ref 0.00–0.07)
Basophils Absolute: 0 10*3/uL (ref 0.0–0.1)
Basophils Relative: 1 %
Eosinophils Absolute: 0.1 10*3/uL (ref 0.0–0.5)
Eosinophils Relative: 1 %
HCT: 36.2 % (ref 36.0–46.0)
Hemoglobin: 11.8 g/dL — ABNORMAL LOW (ref 12.0–15.0)
Immature Granulocytes: 0 %
Lymphocytes Relative: 28 %
Lymphs Abs: 1.8 10*3/uL (ref 0.7–4.0)
MCH: 27.5 pg (ref 26.0–34.0)
MCHC: 32.6 g/dL (ref 30.0–36.0)
MCV: 84.4 fL (ref 80.0–100.0)
Monocytes Absolute: 0.4 10*3/uL (ref 0.1–1.0)
Monocytes Relative: 6 %
Neutro Abs: 4.2 10*3/uL (ref 1.7–7.7)
Neutrophils Relative %: 64 %
Platelets: 120 10*3/uL — ABNORMAL LOW (ref 150–400)
RBC: 4.29 MIL/uL (ref 3.87–5.11)
RDW: 14.6 % (ref 11.5–15.5)
Smear Review: NORMAL
WBC: 6.5 10*3/uL (ref 4.0–10.5)
nRBC: 0 % (ref 0.0–0.2)

## 2021-05-27 LAB — COMPREHENSIVE METABOLIC PANEL
ALT: 18 U/L (ref 0–44)
AST: 30 U/L (ref 15–41)
Albumin: 4 g/dL (ref 3.5–5.0)
Alkaline Phosphatase: 257 U/L — ABNORMAL HIGH (ref 38–126)
Anion gap: 9 (ref 5–15)
BUN: 14 mg/dL (ref 6–20)
CO2: 24 mmol/L (ref 22–32)
Calcium: 9.3 mg/dL (ref 8.9–10.3)
Chloride: 99 mmol/L (ref 98–111)
Creatinine, Ser: 1.05 mg/dL — ABNORMAL HIGH (ref 0.44–1.00)
GFR, Estimated: >60 mL/min (ref >60)
Glucose, Bld: 438 mg/dL — ABNORMAL HIGH (ref 70–99)
Potassium: 4.5 mmol/L (ref 3.5–5.1)
Sodium: 132 mmol/L — ABNORMAL LOW (ref 135–145)
Total Bilirubin: 0.5 mg/dL (ref 0.3–1.2)
Total Protein: 7.9 g/dL (ref 6.5–8.1)

## 2021-05-27 LAB — CBG MONITORING, ED: Glucose-Capillary: 489 mg/dL — ABNORMAL HIGH (ref 70–99)

## 2021-05-27 LAB — URINALYSIS, ROUTINE W REFLEX MICROSCOPIC
Bilirubin Urine: NEGATIVE
Glucose, UA: >=500 mg/dL — AB
Ketones, ur: NEGATIVE mg/dL
Nitrite: NEGATIVE
Protein, ur: NEGATIVE mg/dL
Specific Gravity, Urine: 1.01 (ref 1.005–1.030)
pH: 6.5 (ref 5.0–8.0)

## 2021-05-27 LAB — URINALYSIS, MICROSCOPIC (REFLEX)

## 2021-05-27 MED ORDER — PROCHLORPERAZINE EDISYLATE 10 MG/2ML IJ SOLN
10.0000 mg | Freq: Once | INTRAMUSCULAR | Status: AC
  Administered 2021-05-27: 10 mg via INTRAVENOUS
  Filled 2021-05-27: qty 2

## 2021-05-27 MED ORDER — IOHEXOL 300 MG/ML  SOLN
100.0000 mL | Freq: Once | INTRAMUSCULAR | Status: AC | PRN
  Administered 2021-05-27: 100 mL via INTRAVENOUS

## 2021-05-27 MED ORDER — LACTATED RINGERS IV BOLUS
1000.0000 mL | Freq: Once | INTRAVENOUS | Status: AC
  Administered 2021-05-27: 1000 mL via INTRAVENOUS

## 2021-05-27 MED ORDER — DIPHENHYDRAMINE HCL 50 MG/ML IJ SOLN
25.0000 mg | Freq: Once | INTRAMUSCULAR | Status: AC
  Administered 2021-05-27: 25 mg via INTRAVENOUS
  Filled 2021-05-27: qty 1

## 2021-05-27 MED ORDER — LIDOCAINE HCL (PF) 1 % IJ SOLN
10.0000 mL | Freq: Once | INTRAMUSCULAR | Status: AC
  Administered 2021-05-28: 10 mL via INTRADERMAL
  Filled 2021-05-27: qty 10

## 2021-05-27 NOTE — ED Notes (Signed)
Attempted to stick pt x2 to establish IV, unsuccessful

## 2021-05-27 NOTE — ED Notes (Signed)
Pt further c/o hyperglycemia with monitor reading "high", pt states reads up to 400

## 2021-05-27 NOTE — ED Triage Notes (Signed)
Pt c/o migraine x 2 weeks and right groin abscess

## 2021-05-27 NOTE — ED Provider Notes (Addendum)
[facility] Provider Note   CSN: [id number] Arrival date & time: 05/27/21  2014     History Chief Complaint  Patient presents with   Migraine   Hyperglycemia    Lisa Stuart is a 60 y.o. female with PMH alcohol abuse, pancreatitis, diabetes, previous GI bleed who presents emergency department for evaluation of migraines, hyperglycemia and a vaginal abscess.  She states the abscess has been present for approximately 3 days and her migraine has been bothering her for approximately 2 weeks.  She is on no abortive or preventive medication for migraines.  She endorses a right-sided headache with photophobia but no associated visual deficits, nausea, vomiting, cough, chest pain, shortness of breath, diarrhea or any other systemic symptoms.  She states that the abscess in her groin is associated with pain that radiates into the rectum and into the thigh.   Migraine Associated symptoms include headaches. Pertinent negatives include no chest pain, no abdominal pain and no shortness of breath.  Hyperglycemia Associated symptoms: no abdominal pain, no chest pain, no dysuria, no fever, no shortness of breath and no vomiting       Past Medical History:  Diagnosis Date   Anemia    Anxiety    Depression    Esophagitis    ETOH abuse    Gallstones    Gallstones    Gastritis    Headache    Pancreatitis    Pyloric stenosis    Shortness of breath    on exertion   Thrombocytopenia (HCC)    UGIB (upper gastrointestinal bleed)     Patient Active Problem List   Diagnosis Date Noted   Gastric outlet obstruction 03/31/2012   Hematemesis 03/31/2012   Reflux esophagitis 03/31/2012   Generalized abdominal pain 03/31/2012   Normocytic anemia 03/31/2012   Thrombocytopenia ([place]) 03/31/2012   Hypomagnesemia 03/31/2012   Alcohol abuse 03/29/2012   GI bleed 03/29/2012   Tachycardia 03/29/2012    Past Surgical History:  Procedure Laterality Date   CESAREAN  SECTION     two times   ESOPHAGOGASTRODUODENOSCOPY  03/31/2012   Procedure: ESOPHAGOGASTRODUODENOSCOPY (EGD);  Surgeon: Lafayette Dragon, MD;  Location: [facility];  Service: Endoscopy;  Laterality: N/A;   FOOT SURGERY     surgery on joint in RT foot, pt states metal in foot   TOTAL ABDOMINAL HYSTERECTOMY     for hx uterine fibroids     OB History   No obstetric history on file.     History reviewed. No pertinent family history.  Social History   Tobacco Use   Smoking status: Every Day    Packs/day: 0.50    Types: Cigarettes   Smokeless tobacco: Never  Substance Use Topics   Alcohol use: No    Comment: At [facility] for alcohol   Drug use: No    Home Medications Prior to Admission medications   Medication Sig Start Date End Date Taking? Authorizing Provider  acamprosate (CAMPRAL) 333 MG tablet Take 1 tablet by mouth in the morning, at noon, and at bedtime. 04/03/21   [provider]  albuterol (PROVENTIL) (2.5 MG/3ML) 0.083% nebulizer solution Inhale 3 mLs into the lungs daily as needed for wheezing. 03/19/21   [provider]  Blood Glucose Monitoring Suppl KIT Please provide monitor,  Strips, needles and fingerstick device.   Dispense trade size strips, needles,  (Pharmacist please provide with affordable option) 02/10/14   Fransico Meadow, PA-C  ferrous sulfate 325 (65 FE) MG tablet Take  325 mg by mouth daily. 07/27/20   [provider]  furosemide (LASIX) 40 MG tablet Take 40 mg by mouth daily. 01/09/21   [provider]  gabapentin (NEURONTIN) 300 MG capsule Take 300 mg by mouth 3 (three) times daily. 04/03/21   [provider]  hydrOXYzine (ATARAX/VISTARIL) 25 MG tablet Take 25 mg by mouth 3 (three) times daily as needed for anxiety or sleep. 03/10/21   [provider]  Insulin Aspart, w/Niacinamide, (FIASP) 100 UNIT/ML SOLN Inject 200 Units into the skin every 3 (three) days. Used in pump 06/06/20   [provider]   lactulose (CHRONULAC) 10 GM/15ML solution Take 30 mLs by mouth 3 (three) times daily as needed for constipation. 01/30/21   [provider]  levothyroxine (SYNTHROID) 50 MCG tablet Take 50 mcg by mouth daily. 02/15/21   [provider]  LORazepam (ATIVAN) 0.5 MG tablet Take 0.5 mg by mouth 2 (two) times daily as needed for anxiety. 04/03/21   [provider]  mirtazapine (REMERON) 15 MG tablet Take 15 mg by mouth at bedtime. 04/02/21   [provider]  montelukast (SINGULAIR) 10 MG tablet Take 10 mg by mouth at bedtime. 03/01/21   [provider]  Multiple Vitamin (MULTIVITAMIN WITH MINERALS) TABS Take 1 tablet by mouth daily. 04/02/12   Samuella Cota, MD  pantoprazole (PROTONIX) 40 MG tablet Take 40 mg by mouth daily. 02/18/21   [provider]  promethazine (PHENERGAN) 25 MG tablet Take 25 mg by mouth 2 (two) times daily as needed for nausea/vomiting. 12/31/20   [provider]  QUEtiapine (SEROQUEL) 200 MG tablet Take 200 mg by mouth at bedtime. 12/04/20   [provider]  spironolactone (ALDACTONE) 50 MG tablet Take 100 mg by mouth daily. 01/09/21   [provider]  tiZANidine (ZANAFLEX) 2 MG tablet Take 2 mg by mouth every 6 (six) hours as needed for muscle spasms. 03/27/21   [provider]  topiramate (TOPAMAX) 25 MG tablet Take 25 mg by mouth daily.    [provider]  traMADol (ULTRAM) 50 MG tablet Take 50 mg by mouth 3 (three) times daily as needed for pain. 01/30/21   [provider]    Allergies    Motrin [ibuprofen], Penicillins, and Tylenol [acetaminophen]  Review of Systems   Review of Systems  Constitutional:  Negative for chills and fever.  HENT:  Negative for ear pain and sore throat.   Eyes:  Negative for pain and visual disturbance.  Respiratory:  Negative for cough and shortness of breath.   Cardiovascular:  Negative for chest pain and palpitations.  Gastrointestinal:   Negative for abdominal pain and vomiting.  Genitourinary:  Positive for vaginal pain. Negative for dysuria and hematuria.  Musculoskeletal:  Negative for arthralgias and back pain.  Skin:  Negative for color change and rash.  Neurological:  Positive for headaches. Negative for seizures and syncope.  All other systems reviewed and are negative.  Physical Exam Updated Vital Signs BP 125/82   Pulse 94   Temp 98.3 F (36.8 C) (Oral)   Resp 18   Ht $R'5\' 1"'gg$  (1.549 m)   Wt 68 kg   SpO2 94%   BMI 28.34 kg/m   Physical Exam Vitals and nursing note reviewed.  Constitutional:      General: She is not in acute distress.    Appearance: She is well-developed.  HENT:     Head: Normocephalic and atraumatic.  Eyes:  Conjunctiva/sclera: Conjunctivae normal.  Cardiovascular:     Rate and Rhythm: Normal rate and regular rhythm.     Heart sounds: No murmur heard. Pulmonary:     Effort: Pulmonary effort is normal. No respiratory distress.     Breath sounds: Normal breath sounds.  Abdominal:     Palpations: Abdomen is soft.     Tenderness: There is no abdominal tenderness.  Genitourinary:    Comments: 2 cm area of fluctuance and induration over the labia majora on the right Musculoskeletal:     Cervical back: Neck supple.  Skin:    General: Skin is warm and dry.  Neurological:     Mental Status: She is alert.    ED Results / Procedures / Treatments   Labs (all labs ordered are listed, but only abnormal results are displayed) Labs Reviewed  COMPREHENSIVE METABOLIC PANEL - Abnormal; Notable for the following components:      Result Value   Sodium 132 (*)    Glucose, Bld 438 (*)    Creatinine, Ser 1.05 (*)    Alkaline Phosphatase 257 (*)    All other components within normal limits  CBC WITH DIFFERENTIAL/PLATELET - Abnormal; Notable for the following components:   Hemoglobin 11.8 (*)    Platelets 120 (*)    All other components within normal limits  URINALYSIS, ROUTINE W REFLEX  MICROSCOPIC - Abnormal; Notable for the following components:   Glucose, UA >=500 (*)    Hgb urine dipstick SMALL (*)    Leukocytes,Ua TRACE (*)    All other components within normal limits  URINALYSIS, MICROSCOPIC (REFLEX) - Abnormal; Notable for the following components:   Bacteria, UA FEW (*)    Non Squamous Epithelial PRESENT (*)    All other components within normal limits  CBG MONITORING, ED - Abnormal; Notable for the following components:   Glucose-Capillary 489 (*)    All other components within normal limits    EKG None  Radiology CT ABDOMEN PELVIS W CONTRAST  Result Date: 05/27/2021 CLINICAL DATA:  Abdominal infection suspected. Concern for pelvic abscess. Evaluate for fistula. EXAM: CT ABDOMEN AND PELVIS WITH CONTRAST TECHNIQUE: Multidetector CT imaging of the abdomen and pelvis was performed using the standard protocol following bolus administration of intravenous contrast. CONTRAST:  13mL OMNIPAQUE IOHEXOL 300 MG/ML  SOLN COMPARISON:  CT abdomen and pelvis 02/10/2014. FINDINGS: Lower chest: There is minimal atelectasis in the lung bases. Hepatobiliary: There is nodular liver contour. The liver is enlarged. No focal liver lesions are seen. There is a punctate calcification in the left lobe, likely calcified granuloma. The gallbladder is contracted. No gallstones or biliary ductal dilatation. Pancreas: Unremarkable. No pancreatic ductal dilatation or surrounding inflammatory changes. Spleen: Normal in size without focal abnormality. Adrenals/Urinary Tract: Adrenal glands are unremarkable. Kidneys are normal, without renal calculi, focal lesion, or hydronephrosis. Bladder is unremarkable. Stomach/Bowel: Stomach is within normal limits. Appendix appears normal. No evidence of bowel wall thickening, distention, or inflammatory changes. There is diffuse colonic diverticulosis without evidence for acute diverticulitis. No perirectal fluid collection or inflammation. No fistula identified.  Vascular/Lymphatic: Aortic atherosclerosis. No enlarged abdominal or pelvic lymph nodes. There are few prominent right inguinal lymph nodes. Reproductive: Status post hysterectomy. No adnexal masses. Other: No abdominal wall hernia or abnormality. No abdominopelvic ascites. No pelvic abscess. Musculoskeletal: No acute or significant osseous findings. IMPRESSION: 1.  No acute localizing process in the abdomen or pelvis. 2.  Cirrhotic liver. 3.  Aortic Atherosclerosis (ICD10-I70.0). 4. Colonic diverticulosis without evidence for acute  diverticulitis. Electronically Signed   By: Ronney Asters M.D.   On: 05/27/2021 23:37    Procedures .Marland KitchenIncision and Drainage  Date/Time: 05/28/2021 12:18 AM Performed by: Teressa Lower, MD Authorized by: Teressa Lower, MD   Location:    Type:  Fluid collection   Size:  2 cm   Location: labia majora. Pre-procedure details:    Skin preparation:  Povidone-iodine Sedation:    Sedation type:  None Anesthesia:    Anesthesia method:  Local infiltration   Local anesthetic:  Lidocaine 1% w/o epi Procedure type:    Complexity:  Simple Procedure details:    Ultrasound guidance: no     Needle aspiration: no     Incision types:  Single straight   Incision depth:  Submucosal   Wound management:  Debrided   Drainage:  Bloody   Drainage amount:  Moderate   Wound treatment:  Wound left open   Packing materials:  None Post-procedure details:    Procedure completion:  Tolerated well, no immediate complications   Medications Ordered in ED Medications  lidocaine (PF) (XYLOCAINE) 1 % injection 10 mL (has no administration in time range)  prochlorperazine (COMPAZINE) injection 10 mg (10 mg Intravenous Given 05/27/21 2221)  diphenhydrAMINE (BENADRYL) injection 25 mg (25 mg Intravenous Given 05/27/21 2220)  lactated ringers bolus 1,000 mL (1,000 mLs Intravenous New Bag/Given 05/27/21 2219)  iohexol (OMNIPAQUE) 300 MG/ML solution 100 mL (100 mLs Intravenous Contrast Given  05/27/21 2248)    ED Course  I have reviewed the triage vital signs and the nursing notes.  Pertinent labs & imaging results that were available during my care of the patient were reviewed by me and considered in my medical decision making (see chart for details).    MDM Rules/Calculators/A&P                           Patient seen the emergency department for evaluation of multiple complaints as described above.  Physical exam reveals a 2 cm area of induration and tenderness over the labia majora on the right consistent with an abscess.  Laboratory evaluation with a glucose of 438 on chemistry, hemoglobin 11.8,with no ketones.  CT abdomen pelvis with no acute findings and no fistulizing element of the patient's abscess.  Patient given 1 L lactated Ringer's repeat CBG is pending. the abscess was drained at bedside which revealed only bloody discharge.  No purulent discharge at all.  Present presentation likely related to hematoma more than abscess but we will cover with antibiotics due to the patient's high risk features.  Patient signed out to oncoming provider and will be discharged if sugars correcting appropriately. Final Clinical Impression(s) / ED Diagnoses Final diagnoses:  None    Rx / DC Orders ED Discharge Orders     None        Yamil Oelke, Debe Coder, MD 05/28/21 0017    Teressa Lower, MD 05/28/21 [phone]

## 2021-05-28 DIAGNOSIS — N764 Abscess of vulva: Secondary | ICD-10-CM | POA: Diagnosis not present

## 2021-05-28 LAB — CBG MONITORING, ED: Glucose-Capillary: 366 mg/dL — ABNORMAL HIGH (ref 70–99)

## 2021-05-28 MED ORDER — DOXYCYCLINE HYCLATE 100 MG PO CAPS: 100.0000 mg | ORAL_CAPSULE | Freq: Two times a day (BID) | ORAL | 0 refills | Status: AC

## 2021-05-28 NOTE — ED Notes (Signed)
Discharge instructions discussed with pt. Pt verbalized understanding. Pt stable and ambulatory. No signature pad available. 

## 2021-12-09 IMAGING — CT CT ANGIO NECK
1 of 10 series · 6 of 33 positions shown · IV contrast (omnipaque)
Comparison: CT head June 2020

CLINICAL DATA: Neuro deficit, acute, stroke suspected

EXAM:
CT ANGIOGRAPHY HEAD AND NECK
TECHNIQUE: Multidetector CT imaging of the head and neck was performed using
the standard protocol during bolus administration of intravenous
contrast. Multiplanar CT image reconstructions and MIPs were
obtained to evaluate the vascular anatomy. Carotid stenosis
measurements (when applicable) are obtained utilizing NASCET
criteria, using the distal internal carotid diameter as the
denominator.
CONTRAST:  75mL OMNIPAQUE IOHEXOL 350 MG/ML SOLN

[Series 13: ax thin · axial · 0.42mm/px · z∈[-292,-72]mm · 6 of 311 slices shown]
[im 45/311  soft-tissue]
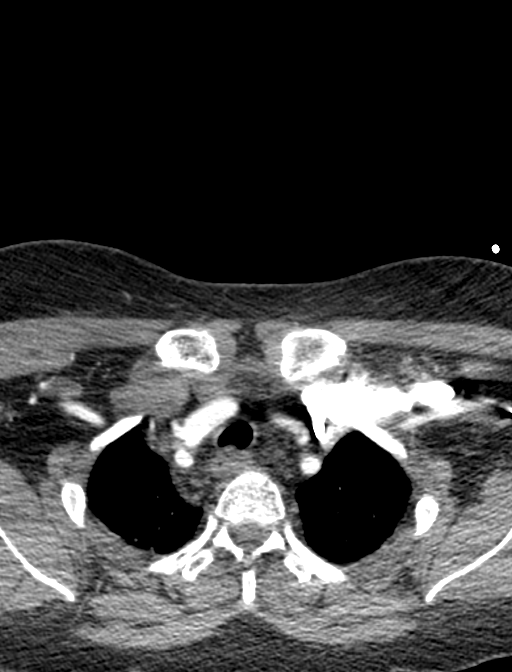
[im 89/311  bone]
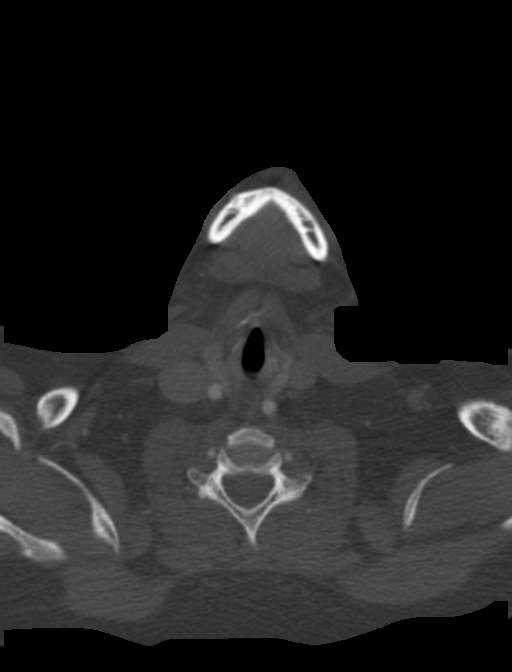
[im 133/311  soft-tissue]
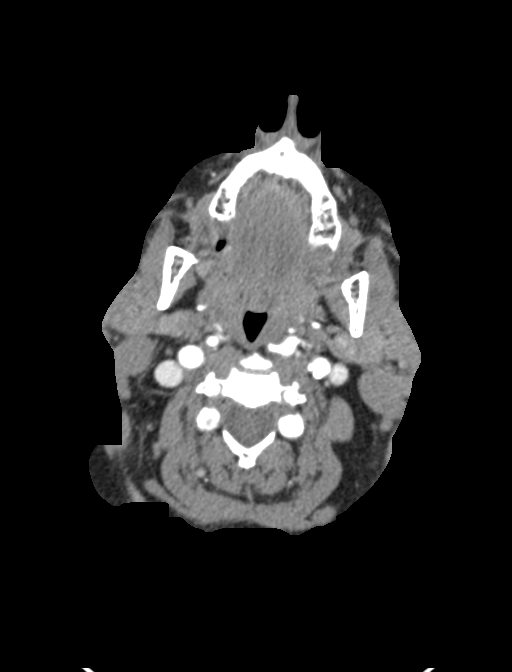
[im 178/311  bone]
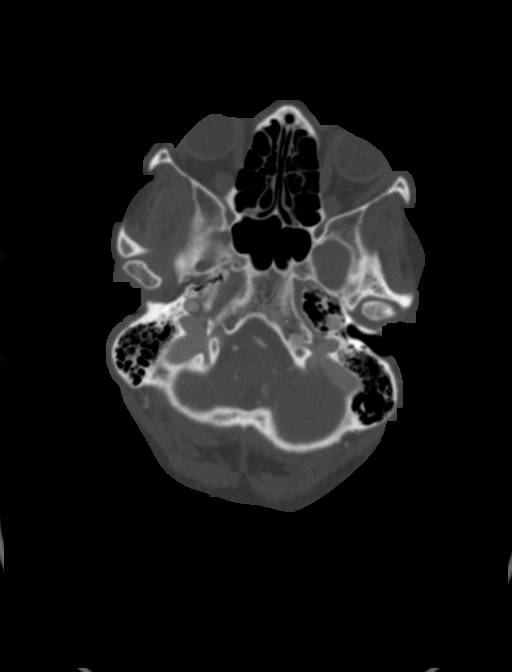
[im 222/311  soft-tissue]
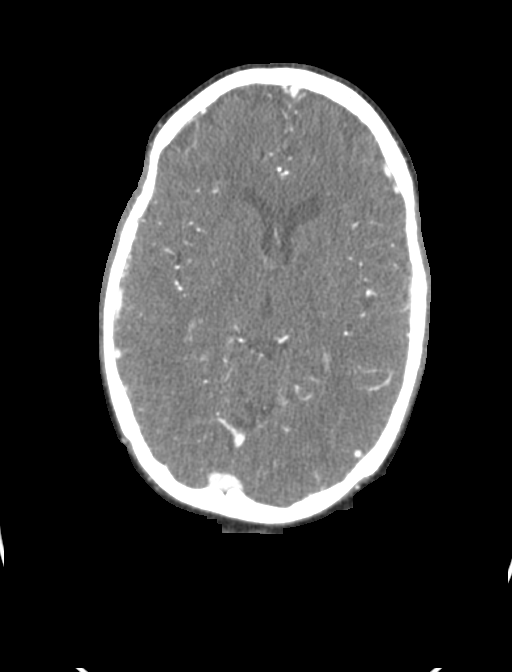
[im 266/311  bone]
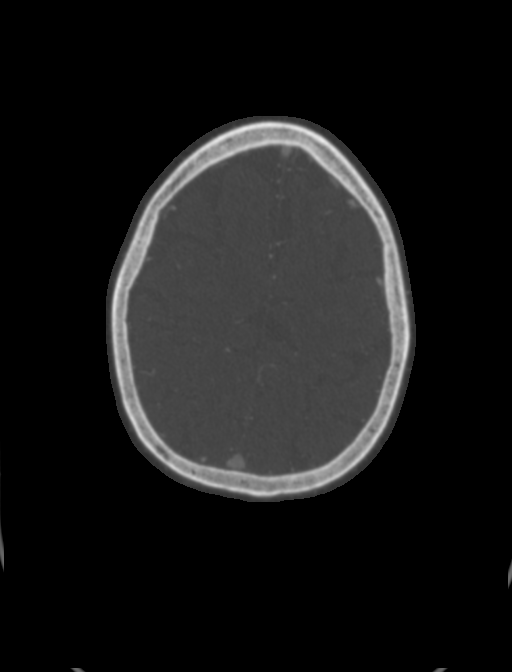

[6 of 33 positions shown; findings below may reference images not displayed]

FINDINGS: CT HEAD

Brain: There is no acute intracranial hemorrhage, mass effect, or
edema. Gray-white differentiation is preserved. There is no
extra-axial fluid collection. Ventricles and sulci are within normal
limits in size and configuration.

Vascular: No hyperdense vessel.

Skull: Findings are

Sinuses/Orbits: No acute finding.

Other: None.

Review of the MIP images confirms the above findings

CTA NECK

Aortic arch: Great vessel origins are patent.

Right carotid system: Patent. Minor calcified plaque along the
proximal internal carotid without stenosis.

Left carotid system: Patent. Mild calcified plaque along the
proximal internal carotid without stenosis.

Vertebral arteries: Patent and codominant.  No stenosis.

Skeleton: Mild cervical spine degenerative changes.

Other neck: Unremarkable within limitation of motion artifact.

Upper chest: No apical lung mass.

Review of the MIP images confirms the above findings

CTA HEAD

Anterior circulation: Intracranial internal carotid arteries are
patent with mild calcified plaque. Anterior and middle cerebral
arteries are patent.

Posterior circulation: Intracranial vertebral arteries are patent.
Basilar artery is patent. Major cerebellar artery origins are
patent. Bilateral posterior communicating arteries are present.
Posterior cerebral arteries are patent.

Venous sinuses: Patent as allowed by contrast bolus timing.

Review of the MIP images confirms the above findings
IMPRESSION: No acute intracranial abnormality.

No large vessel occlusion, hemodynamically significant stenosis, or
evidence of dissection.

## 2023-10-24 DEATH — deceased
